# Patient Record
Sex: Male | Born: 1990 | Race: White | Hispanic: No | Marital: Married | Smoking: Former smoker
Health system: Southern US, Community
[De-identification: ages and names within clinical notes are randomized; demographics above are authoritative.]

## PROBLEM LIST (undated history)

## (undated) DIAGNOSIS — J45909 Unspecified asthma, uncomplicated: Secondary | ICD-10-CM

## (undated) HISTORY — DX: Unspecified asthma, uncomplicated: J45.909

---

## 2013-07-09 DIAGNOSIS — J45909 Unspecified asthma, uncomplicated: Secondary | ICD-10-CM

## 2013-07-09 HISTORY — DX: Unspecified asthma, uncomplicated: J45.909

## 2016-05-18 ENCOUNTER — Ambulatory Visit (INDEPENDENT_AMBULATORY_CARE_PROVIDER_SITE_OTHER): Payer: BC Managed Care – PPO | Admitting: Family Medicine

## 2016-05-18 ENCOUNTER — Encounter: Payer: Self-pay | Admitting: Family Medicine

## 2016-05-18 ENCOUNTER — Ambulatory Visit (INDEPENDENT_AMBULATORY_CARE_PROVIDER_SITE_OTHER): Payer: BC Managed Care – PPO

## 2016-05-18 DIAGNOSIS — M79644 Pain in right finger(s): Secondary | ICD-10-CM

## 2016-05-18 DIAGNOSIS — M20021 Boutonniere deformity of right finger(s): Secondary | ICD-10-CM | POA: Diagnosis not present

## 2016-05-18 DIAGNOSIS — M25562 Pain in left knee: Secondary | ICD-10-CM

## 2016-05-18 DIAGNOSIS — G8929 Other chronic pain: Secondary | ICD-10-CM | POA: Diagnosis not present

## 2016-05-18 DIAGNOSIS — W208XXA Other cause of strike by thrown, projected or falling object, initial encounter: Secondary | ICD-10-CM

## 2016-05-18 DIAGNOSIS — S63266A Dislocation of metacarpophalangeal joint of right little finger, initial encounter: Secondary | ICD-10-CM | POA: Diagnosis not present

## 2016-05-18 DIAGNOSIS — Y9367 Activity, basketball: Secondary | ICD-10-CM | POA: Diagnosis not present

## 2016-05-18 DIAGNOSIS — M25561 Pain in right knee: Secondary | ICD-10-CM

## 2016-05-18 MED ORDER — DICLOFENAC SODIUM 1 % TD GEL: 4.0000 g | Freq: Four times a day (QID) | TRANSDERMAL | 11 refills | Status: DC

## 2016-05-18 NOTE — Progress Notes (Signed)
Juan Bryan is a 25 y.o. male who presents to [facility] today for right fifth digit injury and bilateral knee pain.  Right finger injury: Patient was playing basketball about 6 months ago when he jammed his right pinky finger. He developed pain and swelling at the PIP. The swelling has gone down however he continues to experience pain especially with ulnar deviation of the PIP joint. He also notes inability to fully extend the PIP joint. He denies any weakness or limitations with flexion. He has not had this evaluated yet. He's tried some over-the-counter medications which helped only a little.  Bilateral knee pain: Present for 2 weeks. Patient notes anterior bilateral knee pain. The insertion of the patellar tendon on the tibia occurring after doing leg presses. He denies any injury swelling locking catching giving way. He does note some popping however. He's tried some over-the-counter medicines which have helped only a little.   History reviewed. No pertinent past medical history. No relevant past medical or surgical history. History reviewed. No pertinent surgical history. Social History  Substance Use Topics  . Smoking status: Never Smoker  . Smokeless tobacco: Never Used  . Alcohol use Not on file   family history is not on file.  ROS:  No headache, visual changes, nausea, vomiting, diarrhea, constipation, dizziness, abdominal pain, skin rash, fevers, chills, night sweats, weight loss, swollen lymph nodes, body aches, joint swelling, muscle aches, chest pain, shortness of breath, mood changes, visual or auditory hallucinations.    Medications: Current Outpatient Prescriptions  Medication Sig Dispense Refill  . albuterol (PROVENTIL HFA;VENTOLIN HFA) 108 (90 Base) MCG/ACT inhaler INHALE 1 TO 2 PUFFS EVERY 4 TO 6 HOURS AS NEEDED    . diclofenac sodium (VOLTAREN) 1 % GEL Apply 4 g topically 4 (four) times daily. To affected joint. 100 g  11   No current facility-administered medications for this visit.    No Known Allergies   Exam:  BP (!) 158/85   Pulse 80   Ht 6\' 2"  (1.88 m)   Wt 290 lb (131.5 kg)   BMI 37.23 kg/m  General: Well Developed, well nourished, and in no acute distress.  Obese Neuro/Psych: Alert and oriented x3, extra-ocular muscles intact, able to move all 4 extremities, sensation grossly intact. Skin: Warm and dry, no rashes noted.  Respiratory: Not using accessory muscles, speaking in full sentences, trachea midline.  Cardiovascular: Pulses palpable, no extremity edema. Abdomen: Does not appear distended. MSK: Right hand: Normal except for fifth digit. Inability to fully extend the PIP joint by about 10. Normal MCP and DIP motion. Normal flexion. Rotation deformity or laxity. Tender to palpation PIP. Pain with ulnar deviation of PIP joint.  Knees bilaterally are unremarkable appearing. Mildly tender to palpation patella tendon insertion onto the tibia bilaterally. Normal flexion and extension strength. Normal motion. Neck/stable ligamentous exam.     No results found for this or any previous visit (from the past 48 hour(s)). Dg Knee Complete 4 Views Left  Result Date: 05/18/2016 CLINICAL DATA:  Chronic bilateral knee pain without reported injury. EXAM: LEFT KNEE - COMPLETE 4+ VIEW COMPARISON:  None. FINDINGS: No evidence of fracture, dislocation, or joint effusion. No evidence of arthropathy or other focal bone abnormality. Soft tissues are unremarkable. IMPRESSION: Normal left knee. Electronically Signed   By: Lupita RaiderJames  Green Jr, M.D.   On: 05/18/2016 09:41   Dg Knee Complete 4 Views Right  Result Date: 05/18/2016 CLINICAL DATA:  Chronic bilateral knee pain.  EXAM: RIGHT KNEE - COMPLETE 4+ VIEW COMPARISON:  None. FINDINGS: No evidence of fracture, dislocation, or joint effusion. No evidence of arthropathy or other focal bone abnormality. Soft tissues are unremarkable. IMPRESSION: Normal right  knee. Electronically Signed   By: Lupita RaiderJames  Green Jr, M.D.   On: 05/18/2016 09:41   Dg Finger Little Right  Result Date: 05/18/2016 CLINICAL DATA:  Chronic right fifth finger discomfort after a basketball injury 6 months ago. EXAM: RIGHT LITTLE FINGER 2+V COMPARISON:  None in PACs FINDINGS: The bones are subjectively adequately mineralized. There is a bony fragment adjacent to the ventral aspect of the base of the middle phalanx. This is consistent with an prior avulsion fracture at the volar plate. The IP joint spaces are preserved. The MCP joint space is preserved. The soft tissues are unremarkable. IMPRESSION: Avulsion from the ventral aspect of the base of the middle phalanx of the fifth finger. Electronically Signed   By: David  Swazil[place]Jordan M.D.   On: 05/18/2016 09:40      Assessment and Plan: 25 y.o. male with  Right fifth digit: Healed old fracture present. Patient may have a central slip as he has a mild boutonniere deformity. Plan to refer daily hand therapy and recheck in 2 months. Treat symptomatically Voltaren gel.  Bilateral knee pain likely patellar tendinitis. Plan for diclofenac gel at home exercise program. Recheck as needed.    Orders Placed This Encounter  Procedures  . DG Finger Little Right    Order Specific Question:   Reason for exam:    Answer:   pip injury 6 months ago    Order Specific Question:   Preferred imaging location?    Answer:   [facility]  . DG Knee Complete 4 Views Left    Please include patellar sunrise, lateral, and weightbearing bilateral AP and bilateral rosenberg views    Standing Status:   Future    Number of Occurrences:   1    Standing Expiration Date:   07/18/2017    Order Specific Question:   Reason for exam:    Answer:   Please include patellar sunrise, lateral, and weightbearing bilateral AP and bilateral rosenberg views    Comments:   Please include patellar sunrise, lateral, and weightbearing bilateral AP and bilateral rosenberg  views    Order Specific Question:   Preferred imaging location?    Answer:   [facility]  . DG Knee Complete 4 Views Right    Please include patellar sunrise, lateral, and weightbearing bilateral AP and bilateral rosenberg views    Standing Status:   Future    Number of Occurrences:   1    Standing Expiration Date:   07/18/2017    Order Specific Question:   Reason for exam:    Answer:   Please include patellar sunrise, lateral, and weightbearing bilateral AP and bilateral rosenberg views    Comments:   Please include patellar sunrise, lateral, and weightbearing bilateral AP and bilateral rosenberg views    Order Specific Question:   Preferred imaging location?    Answer:   [facility]  . Ambulatory referral to Physical Therapy    Referral Priority:   Routine    Referral Type:   Physical Medicine    Referral Reason:   Specialty Services Required    Requested Specialty:   Physical Therapy    Number of Visits Requested:   1    Discussed warning signs or symptoms. Please see discharge instructions. Patient expresses understanding.

## 2016-05-18 NOTE — Patient Instructions (Signed)
Thank you for coming in today. Attend Hand Therapy.  Use voltaren gel.  Return for recheck in 1-2 months.    Patellar Tendinitis Rehab Ask your health care provider which exercises are safe for you. Do exercises exactly as told by your health care provider and adjust them as directed. It is normal to feel mild stretching, pulling, tightness, or discomfort as you do these exercises, but you should stop right away if you feel sudden pain or your pain gets worse.Do not begin these exercises until told by your health care provider. Stretching and range of motion exercises This exercise warms up your muscles and joints and improves the movement and flexibility of your knee. This exercise also helps to relieve pain and stiffness. Exercise A: Hamstring, doorway 1. Lie on your back in front of a doorway with your __________ leg resting against the wall and your other leg flat on the floor in the doorway. There should be a slight bend in your __________ knee. 2. Straighten your __________ knee. You should feel a stretch behind your knee or thigh. If you do not, scoot your buttocks closer to the door. 3. Hold this position for __________ seconds. Repeat __________ times. Complete this stretch __________ times a day. Strengthening exercises These exercises build strength and endurance in your knee. Endurance is the ability to use your muscles for a long time, even after they get tired. Exercise B: Quadriceps, isometric 1. Lie on your back with your __________ leg extended and your other knee bent. 2. Slowly tense the muscles in the front of your __________ thigh. When you do this, you should see your kneecap slide up toward your hip or see increased dimpling just above the knee. This motion will push the back of your knee toward the floor. If this is painful, try putting a rolled-up hand towel under your knee to support it in a bent position. Change the size of the towel to find a position that allows you  to do this exercise without any pain. 3. For __________ seconds, hold the muscle as tight as you can without increasing your pain. 4. Relax the muscles slowly and completely. Repeat __________ times. Complete this exercise __________ times a day. Exercise C: Straight leg raises (quadriceps) 1. Lie on your back with your __________ leg extended and your other knee bent. 2. Tense the muscles in the front of your __________ thigh. When you do this, you should see your kneecap slide up or see increased dimpling just above the knee. 3. Keep these muscles tight as you raise your leg 4-6 inches (10-15 cm) off the floor. Do not let your moving knee bend. 4. Hold this position for __________ seconds. 5. Keep these muscles tense as you slowly lower your leg. 6. Relax your muscles slowly and completely. Repeat __________ times. Complete this exercise __________ times a day. Exercise D: Squats 1. Stand in front of a table, with your feet and knees pointing straight ahead. You may rest your hands on the table for balance but not for support. 2. Slowly bend your knees and lower your hips like you are going to sit in a chair.  Keep your weight over your heels, not over your toes.  Keep your lower legs upright so they are parallel with the table legs.  Do not let your hips go lower than your knees.  Do not bend lower than told by your health care provider.  If your knee pain increases, do not bend as low. 3.  Hold the squat position for __________ seconds. 4. Slowly push with your legs to return to standing. Do not use your hands to pull yourself to standing. Repeat __________ times. Complete this exercise __________ times a day. Exercise E: Step-downs 1. Stand on the edge of a step. 2. Keeping your weight over your __________ heel, slowly bend your __________ knee to bring your __________ heel toward the floor. Lower your heel as far as you can while keeping control and without increasing any  discomfort.  Do not let your __________ knee come forward.  Use your leg muscles, not gravity, to lower your body.  Hold a wall or rail for balance if needed. 3. Slowly push through your heel to lift your body weight back up. 4. Return to the starting position. Repeat __________ times. Complete this exercise __________ times a day. Exercise F: Straight leg raises (hip abductors) 1. Lie on your side with your __________ leg in the top position. Lie so your head, shoulder, knee, and hip line up. You may bend your lower knee to help you keep your balance. 2. Roll your hips slightly forward, so that your hips are stacked directly over each other and your __________ knee is facing forward. 3. Leading with your heel, lift your top leg 4-6 inches (10-15 cm). You should feel the muscles in your outer hip lifting.  Do not let your foot drift forward.  Do not let your knee roll toward the ceiling. 4. Hold this position for __________ seconds. 5. Slowly lower your leg to the starting position. 6. Let your muscles relax completely after each repetition. Repeat __________ times. Complete this exercise __________ times a day. This information is not intended to replace advice given to you by your health care provider. Make sure you discuss any questions you have with your health care provider. Document Released: 06/21/2005 Document Revised: 02/26/2016 Document Reviewed: 03/25/2015 Elsevier Interactive Patient Education  2017 [employer]

## 2016-06-02 ENCOUNTER — Ambulatory Visit (INDEPENDENT_AMBULATORY_CARE_PROVIDER_SITE_OTHER): Payer: BC Managed Care – PPO | Admitting: Physician Assistant

## 2016-06-02 ENCOUNTER — Encounter: Payer: Self-pay | Admitting: Physician Assistant

## 2016-06-02 VITALS — BP 134/88 | HR 73 | Ht 74.0 in | Wt 288.0 lb

## 2016-06-02 DIAGNOSIS — E6609 Other obesity due to excess calories: Secondary | ICD-10-CM | POA: Diagnosis not present

## 2016-06-02 DIAGNOSIS — Z131 Encounter for screening for diabetes mellitus: Secondary | ICD-10-CM | POA: Diagnosis not present

## 2016-06-02 DIAGNOSIS — E66811 Obesity, class 1: Secondary | ICD-10-CM | POA: Insufficient documentation

## 2016-06-02 DIAGNOSIS — Z13220 Encounter for screening for lipoid disorders: Secondary | ICD-10-CM

## 2016-06-02 DIAGNOSIS — J4520 Mild intermittent asthma, uncomplicated: Secondary | ICD-10-CM | POA: Diagnosis not present

## 2016-06-02 MED ORDER — ALBUTEROL SULFATE HFA 108 (90 BASE) MCG/ACT IN AERS: INHALATION_SPRAY | RESPIRATORY_TRACT | 2 refills | Status: DC

## 2016-06-02 NOTE — Progress Notes (Signed)
   Subjective:    Patient ID: Juan Bryan, male    DOB: 12/30/1990, 25 y.o.   MRN: [id number]  HPI  Pt is a 25 yo male who presents to the clinic to establish care. He has no concerns or complaints today. He just wanted labs to make sure everything "looked good".   Negative PMH.   .. Family History  Problem Relation Age of Onset  . Diabetes Mother   . Hypertension Mother   . Diabetes Father   . Hypertension Father    .Marland Kitchen. Social History   Social History  . Marital status: Unknown    Spouse name: N/A  . Number of children: N/A  . Years of education: N/A   Occupational History  . Not on file.   Social History Main Topics  . Smoking status: Former Games developermoker  . Smokeless tobacco: Never Used  . Alcohol use Yes  . Drug use: No  . Sexual activity: Yes   Other Topics Concern  . Not on file   Social History Narrative  . No narrative on file   Pt has lost weight over the past year. He weighs 286 down from 320. He continues to exercise and watch diet.    Asthma- controlled with rescue inhaler use once or twice a month.   Review of Systems  All other systems reviewed and are negative.      Objective:   Physical Exam  Constitutional: He is oriented to person, place, and time. He appears well-developed and well-nourished.  HENT:  Head: Normocephalic and atraumatic.  Cardiovascular: Normal rate, regular rhythm and normal heart sounds.   Pulmonary/Chest: Effort normal and breath sounds normal.  Neurological: He is alert and oriented to person, place, and time.  Psychiatric: He has a normal mood and affect. His behavior is normal.          Assessment & Plan:  Marland Kitchen.Marland Kitchen.Juan Bryan was seen today for establish care.  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 2nd check better. Continue on monitor and if running above 130/90 follow up.

## 2016-07-12 ENCOUNTER — Encounter: Payer: Self-pay | Admitting: Family Medicine

## 2016-07-12 ENCOUNTER — Ambulatory Visit (INDEPENDENT_AMBULATORY_CARE_PROVIDER_SITE_OTHER): Payer: BC Managed Care – PPO | Admitting: Family Medicine

## 2016-07-12 VITALS — BP 152/89 | HR 93 | Temp 98.3°F | Wt 291.0 lb

## 2016-07-12 DIAGNOSIS — J069 Acute upper respiratory infection, unspecified: Secondary | ICD-10-CM | POA: Diagnosis not present

## 2016-07-12 DIAGNOSIS — B9789 Other viral agents as the cause of diseases classified elsewhere: Secondary | ICD-10-CM | POA: Diagnosis not present

## 2016-07-12 MED ORDER — IPRATROPIUM BROMIDE 0.06 % NA SOLN: 2.0000 | NASAL | 6 refills | Status: DC | PRN

## 2016-07-12 MED ORDER — PREDNISONE 10 MG PO TABS: 30.0000 mg | ORAL_TABLET | Freq: Every day | ORAL | 0 refills | Status: DC

## 2016-07-12 MED ORDER — BENZONATATE 200 MG PO CAPS: 200.0000 mg | ORAL_CAPSULE | Freq: Three times a day (TID) | ORAL | 0 refills | Status: DC | PRN

## 2016-07-12 MED ORDER — AZITHROMYCIN 250 MG PO TABS: 250.0000 mg | ORAL_TABLET | Freq: Every day | ORAL | 0 refills | Status: DC

## 2016-07-12 NOTE — Patient Instructions (Signed)
Thank you for coming in today. Call or go to the emergency room if you get worse, have trouble breathing, have chest pains, or palpitations.    Acute Bronchitis, Adult Acute bronchitis is sudden (acute) swelling of the air tubes (bronchi) in the lungs. Acute bronchitis causes these tubes to fill with mucus, which can make it hard to breathe. It can also cause coughing or wheezing. In adults, acute bronchitis usually goes away within 2 weeks. A cough caused by bronchitis may last up to 3 weeks. Smoking, allergies, and asthma can make the condition worse. Repeated episodes of bronchitis may cause further lung problems, such as chronic obstructive pulmonary disease (COPD). What are the causes? This condition can be caused by germs and by substances that irritate the lungs, including:  Cold and flu viruses. This condition is most often caused by the same virus that causes a cold.  Bacteria.  Exposure to tobacco smoke, dust, fumes, and air pollution. What increases the risk? This condition is more likely to develop in people who:  Have close contact with someone with acute bronchitis.  Are exposed to lung irritants, such as tobacco smoke, dust, fumes, and vapors.  Have a weak immune system.  Have a respiratory condition such as asthma. What are the signs or symptoms? Symptoms of this condition include:  A cough.  Coughing up clear, yellow, or green mucus.  Wheezing.  Chest congestion.  Shortness of breath.  A fever.  Body aches.  Chills.  A sore throat. How is this diagnosed? This condition is usually diagnosed with a physical exam. During the exam, your health care provider may order tests, such as chest X-rays, to rule out other conditions. He or she may also:  Test a sample of your mucus for bacterial infection.  Check the level of oxygen in your blood. This is done to check for pneumonia.  Do a chest X-ray or lung function testing to rule out pneumonia and other  conditions.  Perform blood tests. Your health care provider will also ask about your symptoms and medical history. How is this treated? Most cases of acute bronchitis clear up over time without treatment. Your health care provider may recommend:  Drinking more fluids. Drinking more makes your mucus thinner, which may make it easier to breathe.  Taking a medicine for a fever or cough.  Taking an antibiotic medicine.  Using an inhaler to help improve shortness of breath and to control a cough.  Using a cool mist vaporizer or humidifier to make it easier to breathe. Follow these instructions at home: Medicines  Take over-the-counter and prescription medicines only as told by your health care provider.  If you were prescribed an antibiotic, take it as told by your health care provider. Do not stop taking the antibiotic even if you start to feel better. General instructions  Get plenty of rest.  Drink enough fluids to keep your urine clear or pale yellow.  Avoid smoking and secondhand smoke. Exposure to cigarette smoke or irritating chemicals will make bronchitis worse. If you smoke and you need help quitting, ask your health care provider. Quitting smoking will help your lungs heal faster.  Use an inhaler, cool mist vaporizer, or humidifier as told by your health care provider.  Keep all follow-up visits as told by your health care provider. This is important. How is this prevented? To lower your risk of getting this condition again:  Wash your hands often with soap and water. If soap and water are  not available, use hand sanitizer.  Avoid contact with people who have cold symptoms.  Try not to touch your hands to your mouth, nose, or eyes.  Make sure to get the flu shot every year. Contact a health care provider if:  Your symptoms do not improve in 2 weeks of treatment. Get help right away if:  You cough up blood.  You have chest pain.  You have severe shortness of  breath.  You become dehydrated.  You faint or keep feeling like you are going to faint.  You keep vomiting.  You have a severe headache.  Your fever or chills gets worse. This information is not intended to replace advice given to you by your health care provider. Make sure you discuss any questions you have with your health care provider. Document Released: 07/29/2004 Document Revised: 01/14/2016 Document Reviewed: 12/10/2015 Elsevier Interactive Patient Education  2017 [employer].

## 2016-07-12 NOTE — Progress Notes (Signed)
       Juan Bryan is a 26 y.o. male who [facility] today for sore throat cough congestion postnasal drainage. Symptoms present for 10 days worsening recently. No vomiting or diarrhea chest pain or palpitations. He's tried some over-the-counter medicines which has helped a bit. Patient notes cough is nonproductive. He notes his symptoms are not consistent with previous episodes of asthma exacerbations.   No past medical history on file. No past surgical history on file. Social History  Substance Use Topics  . Smoking status: Former Games developermoker  . Smokeless tobacco: Never Used  . Alcohol use Yes   family history includes Diabetes in his father and mother; Hypertension in his father and mother.  ROS as above:  Medications: Current Outpatient Prescriptions  Medication Sig Dispense Refill  . albuterol (PROVENTIL HFA;VENTOLIN HFA) 108 (90 Base) MCG/ACT inhaler INHALE 1 TO 2 PUFFS EVERY 4 TO 6 HOURS AS NEEDED 1 Inhaler 2  . azithromycin (ZITHROMAX) 250 MG tablet Take 1 tablet (250 mg total) by mouth daily. Take first 2 tablets together, then 1 every day until finished. 6 tablet 0  . benzonatate (TESSALON) 200 MG capsule Take 1 capsule (200 mg total) by mouth 3 (three) times daily as needed for cough. 45 capsule 0  . ipratropium (ATROVENT) 0.06 % nasal spray Place 2 sprays into both nostrils every 4 (four) hours as needed for rhinitis. 10 mL 6  . predniSONE (DELTASONE) 10 MG tablet Take 3 tablets (30 mg total) by mouth daily with breakfast. 15 tablet 0   No current facility-administered medications for this visit.    No Known Allergies  Health Maintenance Health Maintenance  Topic Date Due  . HIV Screening  10/09/2005  . TETANUS/TDAP  10/09/2009  . INFLUENZA VACCINE  06/02/2017 (Originally 02/03/2016)     Exam:  BP (!) 152/89   Pulse 93   Temp 98.3 F (36.8 C)  (Oral)   Wt 291 lb (132 kg)   SpO2 99%   BMI 37.36 kg/m  Gen: Well NAD HEENT: EOMI,  MMM Clear nasal drainage in posterior pharynx with cobblestoning. Normal tympanic membranes bilaterally. No cervical lymphadenopathy. Lungs: Normal work of breathing. CTABL Heart: RRR no MRG Abd: NABS, Soft. Nondistended, Nontender Exts: Brisk capillary refill, warm and well perfused.    No results found for this or any previous visit (from the past 72 hour(s)). No results found.    Assessment and Plan: 26 y.o. male with pharyngitis and cough with second sickening. Treatment empirically with Atrovent nasal spray Tessalon Perles. Use prednisone and azithromycin if not better. Return as needed.   No orders of the defined types were placed in this encounter.   Discussed warning signs or symptoms. Please see discharge instructions. Patient expresses understanding.

## 2016-07-13 ENCOUNTER — Ambulatory Visit: Payer: BC Managed Care – PPO | Admitting: Family Medicine

## 2016-07-15 ENCOUNTER — Ambulatory Visit (INDEPENDENT_AMBULATORY_CARE_PROVIDER_SITE_OTHER): Payer: BC Managed Care – PPO

## 2016-07-15 ENCOUNTER — Encounter: Payer: Self-pay | Admitting: Family Medicine

## 2016-07-15 ENCOUNTER — Ambulatory Visit (INDEPENDENT_AMBULATORY_CARE_PROVIDER_SITE_OTHER): Payer: BC Managed Care – PPO | Admitting: Family Medicine

## 2016-07-15 VITALS — BP 156/100 | HR 110 | Temp 98.1°F | Wt 292.0 lb

## 2016-07-15 DIAGNOSIS — R0602 Shortness of breath: Secondary | ICD-10-CM | POA: Diagnosis not present

## 2016-07-15 DIAGNOSIS — R05 Cough: Secondary | ICD-10-CM | POA: Diagnosis not present

## 2016-07-15 DIAGNOSIS — R062 Wheezing: Secondary | ICD-10-CM

## 2016-07-15 DIAGNOSIS — J4520 Mild intermittent asthma, uncomplicated: Secondary | ICD-10-CM | POA: Diagnosis not present

## 2016-07-15 MED ORDER — GUAIFENESIN-CODEINE 100-10 MG/5ML PO SOLN: 5.0000 mL | Freq: Every evening | ORAL | 0 refills | Status: DC | PRN

## 2016-07-15 MED ORDER — METHYLPREDNISOLONE 8 MG PO TABS: 8.0000 mg | ORAL_TABLET | Freq: Every day | ORAL | 0 refills | Status: DC

## 2016-07-15 MED ORDER — IPRATROPIUM-ALBUTEROL 0.5-2.5 (3) MG/3ML IN SOLN: 3.0000 mL | Freq: Once | RESPIRATORY_TRACT | Status: DC

## 2016-07-15 NOTE — Progress Notes (Signed)
Juan Bryan is a 26 y.o. male who [facility] today for cough congestion shortness of breath runny nose and wheezing. This is also associated with a headache. Symptoms have been ongoing now for some time. Patient was seen on January 8 URI and prescribed Atrovent nasal spray, albuterol, and Tessalon Perles. He was also prescribed backup prednisone and azithromycin. He has started the azithromycin but not started taking prednisone. He notes he thinks he may be allergic to prednisone he felt hot spots on his body when he was taking and after a root canal in the past.  He notes he's been using his albuterol infrequently but thinks it does help his shortness of breath when he does use it. He is a pertinent medical history significant for asthma. He denies any vomiting or diarrhea.   Past Medical History:  Diagnosis Date  . Asthma 07/09/2013   No past surgical history on file. Social History  Substance Use Topics  . Smoking status: Former Games developermoker  . Smokeless tobacco: Never Used  . Alcohol use Yes   family history includes Diabetes in his father and mother; Hypertension in his father and mother.  ROS as above:  Medications: Current Outpatient Prescriptions  Medication Sig Dispense Refill  . albuterol (PROVENTIL HFA;VENTOLIN HFA) 108 (90 Base) MCG/ACT inhaler INHALE 1 TO 2 PUFFS EVERY 4 TO 6 HOURS AS NEEDED 1 Inhaler 2  . azithromycin (ZITHROMAX) 250 MG tablet Take 1 tablet (250 mg total) by mouth daily. Take first 2 tablets together, then 1 every day until finished. 6 tablet 0  . benzonatate (TESSALON) 200 MG capsule Take 1 capsule (200 mg total) by mouth 3 (three) times daily as needed for cough. 45 capsule 0  . guaiFENesin-codeine 100-10 MG/5ML syrup Take 5 mLs by mouth at bedtime as needed for cough. 180 mL 0  . ipratropium (ATROVENT) 0.06 % nasal spray Place 2  sprays into both nostrils every 4 (four) hours as needed for rhinitis. 10 mL 6  . methylPREDNISolone (MEDROL) 8 MG tablet Take 1 tablet (8 mg total) by mouth daily. 5 tablet 0   Current Facility-Administered Medications  Medication Dose Route Frequency Provider Last Rate Last Dose  . ipratropium-albuterol (DUONEB) 0.5-2.5 (3) MG/3ML nebulizer solution 3 mL  3 mL Nebulization Once Rodolph BongEvan S Jeanne Diefendorf, MD       No Known Allergies  Health Maintenance Health Maintenance  Topic Date Due  . HIV Screening  10/09/2005  . TETANUS/TDAP  10/09/2009  . INFLUENZA VACCINE  06/02/2017 (Originally 02/03/2016)     Exam:  BP (!) 156/100   Pulse (!) 110   Temp 98.1 F (36.7 C) (Oral)   Wt 292 lb (132.5 kg)   BMI 37.49 kg/m  Gen: Well NAD HEENT: EOMI,  MMM Clear nasal discharge. Posterior pharynx cobblestoning. Normal tympanic membranes bilaterally. Lungs: Normal work of breathing. Prolonged respiratory phase present bilaterally Heart: Mild tachycardia no MRG Abd: NABS, Soft. Nondistended, Nontender Exts: Brisk capillary refill, warm and well perfused.   Patient was given a 2.5/0.5 mg DuoNeb nebulizer treatment, and felt better   No results found for this or any previous visit (from the past 72 hour(s)). Dg Chest 2 View  Result Date: 07/15/2016 CLINICAL DATA:  Cough and shortness of Breath EXAM: CHEST  2 VIEW COMPARISON:  None. FINDINGS: The heart size and mediastinal contours are within normal limits. Both lungs are clear. The visualized skeletal structures are unremarkable. IMPRESSION: No  active cardiopulmonary disease. Electronically Signed   By: Alcide Clever M.D.   On: 07/15/2016 13:49      Assessment and Plan: 26 y.o. male with Worsening respiratory symptoms very likely due to virus. Patient also has asthma and I think this is contributing to his symptoms. Plan to check a chest x-ray to assess for pneumonia. Will use albuterol more frequently and prescribed codeine for cough suppression. I'm  doubtful that patient truly has an allergy to prednisone however it's reasonable to try use a different type of steroid. We'll use Medrol daily and recheck if not better.    Orders Placed This Encounter  Procedures  . DG Chest 2 View    Order Specific Question:   Reason for exam:    Answer:   Cough, assess intra-thoracic pathology    Order Specific Question:   Preferred imaging location?    Answer:   [facility]    Discussed warning signs or symptoms. Please see discharge instructions. Patient expresses understanding.

## 2016-07-15 NOTE — Patient Instructions (Signed)
Thank you for coming in today. Take the medrol daily.  Use albuterol as needed for wheezing.  Get a chest xray today.  Use the cough medicine at bedtime.   Call or go to the emergency room if you get worse, have trouble breathing, have chest pains, or palpitations.

## 2016-08-18 ENCOUNTER — Ambulatory Visit (INDEPENDENT_AMBULATORY_CARE_PROVIDER_SITE_OTHER): Payer: BC Managed Care – PPO | Admitting: Physician Assistant

## 2016-08-18 ENCOUNTER — Encounter: Payer: Self-pay | Admitting: Physician Assistant

## 2016-08-18 VITALS — BP 175/101 | HR 73 | Temp 98.0°F | Ht 74.0 in | Wt 285.0 lb

## 2016-08-18 DIAGNOSIS — N50812 Left testicular pain: Secondary | ICD-10-CM | POA: Diagnosis not present

## 2016-08-18 DIAGNOSIS — I861 Scrotal varices: Secondary | ICD-10-CM | POA: Diagnosis not present

## 2016-08-18 DIAGNOSIS — I10 Essential (primary) hypertension: Secondary | ICD-10-CM

## 2016-08-18 DIAGNOSIS — N5089 Other specified disorders of the male genital organs: Secondary | ICD-10-CM

## 2016-08-18 DIAGNOSIS — N509 Disorder of male genital organs, unspecified: Secondary | ICD-10-CM | POA: Diagnosis not present

## 2016-08-18 NOTE — Progress Notes (Signed)
   Subjective:    Patient ID: Juan Bryan, male    DOB: 05/31/1991, 26 y.o.   MRN: [id number]  HPI  Pt is a 26 yo male who presents to the clinic with painful lump of left testicle for last month. He noticed it after lifting heavy weights at gym and not being used to it. Pain is not constant. At times it will be sharp and radiate into left abdomen. Not tried anything to make better. Seems to only be worse with lifting weights. No swelling, redness. No fever, chills.    BP elevated. Denies any CP, palpitations, headaches, dizziness. Never been on medication.     Review of Systems  All other systems reviewed and are negative.      Objective:   Physical Exam  Constitutional: He appears well-developed and well-nourished.  HENT:  Head: Normocephalic and atraumatic.  Cardiovascular: Normal rate, regular rhythm and normal heart sounds.   Genitourinary:  Genitourinary Comments: Testicular exam was normal. I did not palpate any mass. No hernia detected. No swelling, redness, or tenderness.  There was an area where patient stated he felt mass and seemed to be a small variocele.           Assessment & Plan:  Marland Kitchen.Marland Kitchen.Diagnoses and all orders for this visit:  Left testicular pain -     US Art/Ven Flow Abd Pelv Doppler -     US Scrotum; Future -     US Scrotum  Mass of left testis -     US Art/Ven Flow Abd Pelv Doppler -     US Scrotum; Future -     US Scrotum  Essential hypertension -     lisinopril (PRINIVIL,ZESTRIL) 10 MG tablet; Take 1 tablet (10 mg total) by mouth daily. For blood pressure control.  Varicocele   HO given. Reassurance given. Watch lifting weights. If pain worsens or continues let me know. U/S ordered for full evaluation. NSAID and ice as needed for discomfort.   BP medication started. BP elevated today. Need to get down. Recheck in 1 month.

## 2016-08-18 NOTE — Patient Instructions (Signed)
Varicocele A varicocele is a swelling of veins in the scrotum. The scrotum is the sac that contains the testicles. Varicoceles can occur on either side of the scrotum, but they are more common on the left side. They occur most often in teenage boys and young men. In most cases, varicoceles are not a serious problem. They are usually small and painless and do not require treatment. Tests may be done to confirm the diagnosis. Treatment may be needed if:  A varicocele is large, causes a lot of pain, or causes pain when exercising.  Varicoceles are found on both sides of the scrotum.  The testicle on the opposite side is absent or not normal.  A varicocele causes a decrease in the size of the testicle in a growing adolescent.  The person has fertility problems. What are the causes? This condition is the result of valves in the veins not working properly. Valves in the veins help to return blood from the scrotum and testicles to the heart. If these valves do not work well, blood flows backward and backs up into the veins, which causes the veins to swell. This is similar to what happens when varicose veins form in the leg. What are the signs or symptoms? Most varicoceles do not cause any symptoms. If symptoms do occur, they may include:  Swelling on one side of the scrotum. The swelling may be more obvious when you are standing up.  A lumpy feeling in the scrotum.  A heavy feeling on one side of the scrotum.  A dull ache in the scrotum, especially after exercise or prolonged standing or sitting.  Slower growth or reduced size of the testicle on the side of the varicocele (in young males).  Problems with fertility. These can occur if the testicle does not grow normally. How is this diagnosed? This condition may be diagnosed with a physical exam. You may also have an imaging test, called an ultrasound, to confirm the diagnosis and to help rule out other causes of the swelling. How is this  treated? Treatment is usually not needed for this condition. If you have any pain, your health care provider may prescribe or recommend medicine to help relieve it. You may need regular exams so your health care provider can monitor the varicocele to ensure that it does not cause problems. When further treatment is needed, it may involve one of these options:  Varicocelectomy. This is a surgery in which the swollen veins are tied off so that the flow of blood goes to other veins instead.  Embolization. In this procedure, a small tube (catheter) is used to place metal coils or other blocking items in the veins. This cuts off the blood flow to the swollen veins. Follow these instructions at home:  Take medicines only as directed by your health care provider.  Wear supportive underwear.  Use an athletic supporter for sports.  Keep all follow-up visits as directed by your health care provider. This is important. Contact a health care provider if:  Your pain is increasing.  You have redness in the affected area.  You have swelling that does not decrease when you are lying down.  One of your testicles is smaller than the other.  Your testicle becomes enlarged, swollen, or painful. This information is not intended to replace advice given to you by your health care provider. Make sure you discuss any questions you have with your health care provider. Document Released: 09/27/2000 Document Revised: 12/03/2015 Document Reviewed: 05/29/2014  Elsevier Interactive Patient Education  2017 Elsevier Inc[employer]  

## 2016-08-20 DIAGNOSIS — I861 Scrotal varices: Secondary | ICD-10-CM | POA: Insufficient documentation

## 2016-08-20 DIAGNOSIS — I10 Essential (primary) hypertension: Secondary | ICD-10-CM | POA: Insufficient documentation

## 2016-08-20 DIAGNOSIS — N50812 Left testicular pain: Secondary | ICD-10-CM | POA: Insufficient documentation

## 2016-08-20 MED ORDER — LISINOPRIL 10 MG PO TABS: 10.0000 mg | ORAL_TABLET | Freq: Every day | ORAL | 1 refills | Status: DC

## 2016-08-24 ENCOUNTER — Ambulatory Visit (INDEPENDENT_AMBULATORY_CARE_PROVIDER_SITE_OTHER): Payer: BC Managed Care – PPO

## 2016-08-24 ENCOUNTER — Ambulatory Visit: Payer: BC Managed Care – PPO

## 2016-08-24 DIAGNOSIS — I861 Scrotal varices: Secondary | ICD-10-CM

## 2016-08-24 NOTE — Progress Notes (Signed)
Call pt: ultrasound showed no mass and normal testicle. Small variocele as discussed in office and gave HO on.

## 2017-04-26 ENCOUNTER — Ambulatory Visit (INDEPENDENT_AMBULATORY_CARE_PROVIDER_SITE_OTHER): Payer: BC Managed Care – PPO | Admitting: Physician Assistant

## 2017-04-26 ENCOUNTER — Encounter: Payer: Self-pay | Admitting: Physician Assistant

## 2017-04-26 VITALS — BP 165/82 | HR 70 | Ht 74.0 in

## 2017-04-26 DIAGNOSIS — J302 Other seasonal allergic rhinitis: Secondary | ICD-10-CM | POA: Diagnosis not present

## 2017-04-26 DIAGNOSIS — R072 Precordial pain: Secondary | ICD-10-CM | POA: Diagnosis not present

## 2017-04-26 DIAGNOSIS — J4521 Mild intermittent asthma with (acute) exacerbation: Secondary | ICD-10-CM | POA: Diagnosis not present

## 2017-04-26 DIAGNOSIS — I10 Essential (primary) hypertension: Secondary | ICD-10-CM | POA: Diagnosis not present

## 2017-04-26 MED ORDER — PREDNISONE 50 MG PO TABS: ORAL_TABLET | ORAL | 0 refills | Status: DC

## 2017-04-26 MED ORDER — MONTELUKAST SODIUM 10 MG PO TABS: 10.0000 mg | ORAL_TABLET | Freq: Every day | ORAL | 3 refills | Status: DC

## 2017-04-26 MED ORDER — ALBUTEROL SULFATE HFA 108 (90 BASE) MCG/ACT IN AERS: 2.0000 | INHALATION_SPRAY | Freq: Four times a day (QID) | RESPIRATORY_TRACT | 3 refills | Status: DC | PRN

## 2017-04-26 NOTE — Progress Notes (Signed)
Subjective:    Patient ID: Juan Bryan, male    DOB: Jun 20, 1991, 26 y.o.   MRN: [id number]  HPI  Pt is a 26 yo old male who presents to the clinic with worsening SOB and chest tightness over past month. He has asthma but has been controlled with rescue inhaler only. He feels like this brand of rescue inhaler is not working well and would like to go back to ventolin. Today he feels better but has been having a dry cough with wheezing. He is also having episodic chest pain in center of chest with exercise and sex. Pain is very sharp but then resolves. No fever, chills, sinus pressure or ear pain. Fall always seems to be his trigger month.   .. Active Ambulatory Problems    Diagnosis Date Noted  . Asthma 07/09/2013  . Finger pain, right 05/18/2016  . Knee pain, bilateral 05/18/2016  . 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 Class 2 obesity due to excess calories without serious comorbidity in adult 06/02/2016  . Essential hypertension 08/20/2016  . Varicocele 08/20/2016  . Left testicular pain 08/20/2016  . Precordial chest pain 04/27/2017  . Mild intermittent asthma with exacerbation 04/27/2017  . Seasonal allergies 04/27/2017   Resolved Ambulatory Problems    Diagnosis Date Noted  . No Resolved Ambulatory Problems   Past Medical History:  Diagnosis Date  . Asthma 07/09/2013      Review of Systems  All other systems reviewed and are negative.      Objective:   Physical Exam  Constitutional: He is oriented to person, place, and time. He appears well-developed and well-nourished.  HENT:  Head: Normocephalic and atraumatic.  Right Ear: External ear normal.  Left Ear: External ear normal.  Nose: Nose normal.  Mouth/Throat: Oropharynx is clear and moist.  Eyes: Conjunctivae are normal. Right eye exhibits no discharge. Left eye exhibits no discharge.  Cardiovascular: Normal rate, regular rhythm and normal heart sounds.   Pulmonary/Chest: Effort normal and  breath sounds normal. He exhibits no tenderness.  Lymphadenopathy:    He has no cervical adenopathy.  Neurological: He is alert and oriented to person, place, and time.  Psychiatric: He has a normal mood and affect. His behavior is normal.          Assessment & Plan:  Marland KitchenMarland KitchenOrlen was seen today for asthma.  Diagnoses and all orders for this visit:  Mild intermittent asthma with exacerbation -     predniSONE (DELTASONE) 50 MG tablet; Take one tablet for 5 days. -     montelukast (SINGULAIR) 10 MG tablet; Take 1 tablet (10 mg total) by mouth at bedtime. -     albuterol (PROVENTIL HFA;VENTOLIN HFA) 108 (90 Base) MCG/ACT inhaler; Inhale 2 puffs into the lungs every 6 (six) hours as needed for wheezing or shortness of breath.  Precordial chest pain -     EKG 12-Lead  Essential hypertension -     lisinopril (PRINIVIL,ZESTRIL) 10 MG tablet; Take 1 tablet (10 mg total) by mouth daily. For blood pressure control.  Seasonal allergies   EKG done with NSR, no ST elevation or depression, no arrthymias. I do not think CP is pathological. Offered exercise stress test but patient prefers to told off at this time. Will send if current treatment helps symptoms.   For asthma exacerbation added prednisone and restart singulair. Sent ventolin to pharmacy to only fill ventolin. Follow up if still having SOB/wheezing or CP. We could consider daily ICS  in future. Discussed does not need to be using rescue daily.   For allergies start flonase daily.   BP remains elevated needs to restart lisinopril.   Follow up in 2-3 weeks as needed.

## 2017-04-26 NOTE — Patient Instructions (Addendum)
Prednisone for 5 days.  Restart singulair daily.  Consider flonase 2 sprays each nostril.  Restart lisinopril  Will get stress test.

## 2017-04-27 ENCOUNTER — Encounter: Payer: Self-pay | Admitting: Physician Assistant

## 2017-04-27 DIAGNOSIS — R072 Precordial pain: Secondary | ICD-10-CM | POA: Insufficient documentation

## 2017-04-27 DIAGNOSIS — J302 Other seasonal allergic rhinitis: Secondary | ICD-10-CM | POA: Insufficient documentation

## 2017-04-27 DIAGNOSIS — J4521 Mild intermittent asthma with (acute) exacerbation: Secondary | ICD-10-CM | POA: Insufficient documentation

## 2017-04-27 MED ORDER — LISINOPRIL 10 MG PO TABS: 10.0000 mg | ORAL_TABLET | Freq: Every day | ORAL | 1 refills | Status: DC

## 2017-04-28 ENCOUNTER — Telehealth: Payer: Self-pay | Admitting: *Deleted

## 2017-04-28 NOTE — Telephone Encounter (Signed)
Pre Authorization sent to cover my meds. [id number]

## 2017-04-29 NOTE — Telephone Encounter (Signed)
Pre-Auth approved for Ventolin HFA 108. [name]: [id number] - [place] Case ID: [id number] - Rx #: [id number] Pharm notified. Called pt and left a detailed msg.

## 2017-05-17 ENCOUNTER — Ambulatory Visit: Payer: BC Managed Care – PPO | Admitting: Physician Assistant

## 2017-05-17 VITALS — BP 171/98 | HR 74 | Temp 97.9°F | Ht 74.02 in | Wt 272.0 lb

## 2017-05-17 DIAGNOSIS — H6983 Other specified disorders of Eustachian tube, bilateral: Secondary | ICD-10-CM

## 2017-05-17 DIAGNOSIS — I10 Essential (primary) hypertension: Secondary | ICD-10-CM | POA: Diagnosis not present

## 2017-05-17 NOTE — Patient Instructions (Signed)
Eustachian Tube Dysfunction The eustachian tube connects the middle ear to the back of the nose. It regulates air pressure in the middle ear by allowing air to move between the ear and nose. It also helps to drain fluid from the middle ear space. When the eustachian tube does not function properly, air pressure, fluid, or both can build up in the middle ear. Eustachian tube dysfunction can affect one or both ears. What are the causes? This condition happens when the eustachian tube becomes blocked or cannot open normally. This may result from:  Ear infections.  Colds and other upper respiratory infections.  Allergies.  Irritation, such as from cigarette smoke or acid from the stomach coming up into the esophagus (gastroesophageal reflux).  Sudden changes in air pressure, such as from descending in an airplane.  Abnormal growths in the nose or throat, such as nasal polyps, tumors, or enlarged tissue at the back of the throat (adenoids).  What increases the risk? This condition may be more likely to develop in people who smoke and people who are overweight. Eustachian tube dysfunction may also be more likely to develop in children, especially children who have:  Certain birth defects of the mouth, such as cleft palate.  Large tonsils and adenoids.  What are the signs or symptoms? Symptoms of this condition may include:  A feeling of fullness in the ear.  Ear pain.  Clicking or popping noises in the ear.  Ringing in the ear.  Hearing loss.  Loss of balance.  Symptoms may get worse when the air pressure around you changes, such as when you travel to an area of high elevation or fly on an airplane. How is this diagnosed? This condition may be diagnosed based on:  Your symptoms.  A physical exam of your ear, nose, and throat.  Tests, such as those that measure: ? The movement of your eardrum (tympanogram). ? Your hearing (audiometry).  How is this treated? Treatment  depends on the cause and severity of your condition. If your symptoms are mild, you may be able to relieve your symptoms by moving air into ("popping") your ears. If you have symptoms of fluid in your ears, treatment may include:  Decongestants.  Antihistamines.  Nasal sprays or ear drops that contain medicines that reduce swelling (steroids).  In some cases, you may need to have a procedure to drain the fluid in your eardrum (myringotomy). In this procedure, a small tube is placed in the eardrum to:  Drain the fluid.  Restore the air in the middle ear space.  Follow these instructions at home:  Take over-the-counter and prescription medicines only as told by your health care provider.  Use techniques to help pop your ears as recommended by your health care provider. These may include: ? Chewing gum. ? Yawning. ? Frequent, forceful swallowing. ? Closing your mouth, holding your nose closed, and gently blowing as if you are trying to blow air out of your nose.  Do not do any of the following until your health care provider approves: ? Travel to high altitudes. ? Fly in airplanes. ? Work in a pressurized cabin or room. ? Scuba dive.  Keep your ears dry. Dry your ears completely after showering or bathing.  Do not smoke.  Keep all follow-up visits as told by your health care provider. This is important. Contact a health care provider if:  Your symptoms do not go away after treatment.  Your symptoms come back after treatment.  You are   unable to pop your ears.  You have: ? A fever. ? Pain in your ear. ? Pain in your head or neck. ? Fluid draining from your ear.  Your hearing suddenly changes.  You become very dizzy.  You lose your balance. This information is not intended to replace advice given to you by your health care provider. Make sure you discuss any questions you have with your health care provider. Document Released: 07/18/2015 Document Revised: 11/27/2015  Document Reviewed: 07/10/2014 Elsevier Interactive Patient Education  2018 Elsevier Inc.  

## 2017-05-18 ENCOUNTER — Encounter: Payer: Self-pay | Admitting: Physician Assistant

## 2017-05-18 DIAGNOSIS — I10 Essential (primary) hypertension: Secondary | ICD-10-CM | POA: Insufficient documentation

## 2017-05-18 NOTE — Progress Notes (Signed)
   Subjective:    Patient ID: Juan Bryan, male    DOB: 10/04/1990, 26 y.o.   MRN: [id number]  HPI Pt is a 26 yo male who presents to the clinic with bilateral ear pain for last 3 days. He had a cold early November and started feeling better. 3 days ago his ears started hurting him. Right was worse than left for a few days then left worse than right. No ear discharge. No fever, chills, sinus pressure, ST, headache. He did feel like his neck was swollen and tight. He has tried sudafed and does seem to help. He admits to feeling some better today.   .. Active Ambulatory Problems    Diagnosis Date Noted  . Asthma 07/09/2013  . Finger pain, right 05/18/2016  . Knee pain, bilateral 05/18/2016  . 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 Class 2 obesity due to excess calories without serious comorbidity in adult 06/02/2016  . Essential hypertension 08/20/2016  . Varicocele 08/20/2016  . Left testicular pain 08/20/2016  . Precordial chest pain 04/27/2017  . Mild intermittent asthma with exacerbation 04/27/2017  . Seasonal allergies 04/27/2017  . White coat syndrome with diagnosis of hypertension 05/18/2017   Resolved Ambulatory Problems    Diagnosis Date Noted  . No Resolved Ambulatory Problems   Past Medical History:  Diagnosis Date  . Asthma 07/09/2013      Review of Systems  All other systems reviewed and are negative.      Objective:   Physical Exam  Constitutional: He is oriented to person, place, and time. He appears well-developed and well-nourished.  HENT:  Head: Normocephalic and atraumatic.  Right Ear: External ear normal.  Left Ear: External ear normal.  Nose: Nose normal.  Mouth/Throat: No oropharyngeal exudate.  TM's clear.  No sinus tenderness to palpation.  Oropharynx a little erythematous without any tonsilar swelling or exudate.   Eyes: Conjunctivae are normal. Right eye exhibits no discharge. Left eye exhibits no discharge.  Neck: Normal  range of motion. Neck supple.  Cardiovascular: Normal rate, regular rhythm and normal heart sounds.  Pulmonary/Chest: Effort normal and breath sounds normal. He has no wheezes.  Lymphadenopathy:    He has no cervical adenopathy.  Neurological: He is alert and oriented to person, place, and time.  Psychiatric: He has a normal mood and affect. His behavior is normal.          Assessment & Plan:  Marland Kitchen.Marland Kitchen.Juan Bryan was seen today for otitis media and neck pain.  Diagnoses and all orders for this visit:  Eustachian tube dysfunction, bilateral  White coat syndrome with diagnosis of hypertension   Offered prednisone and pt declined. Offered solumedrol shot and patient declined. Start flonase daily. Chew gum. HO given. Follow up if symptoms worsen, develop a fever,or get more sinus pressure.  Reassured I see no signs of infection today.   Pt is checking BP's at home and ranging 120 to 130's over 70's to 80's.

## 2017-12-12 IMAGING — DX DG KNEE COMPLETE 4+V*R*
5 series · 5 of 5 positions shown · non-contrast
Comparison: None.

CLINICAL DATA: Chronic bilateral knee pain.

EXAM:
RIGHT KNEE - COMPLETE 4+ VIEW

[knee lat (1 of 2)]
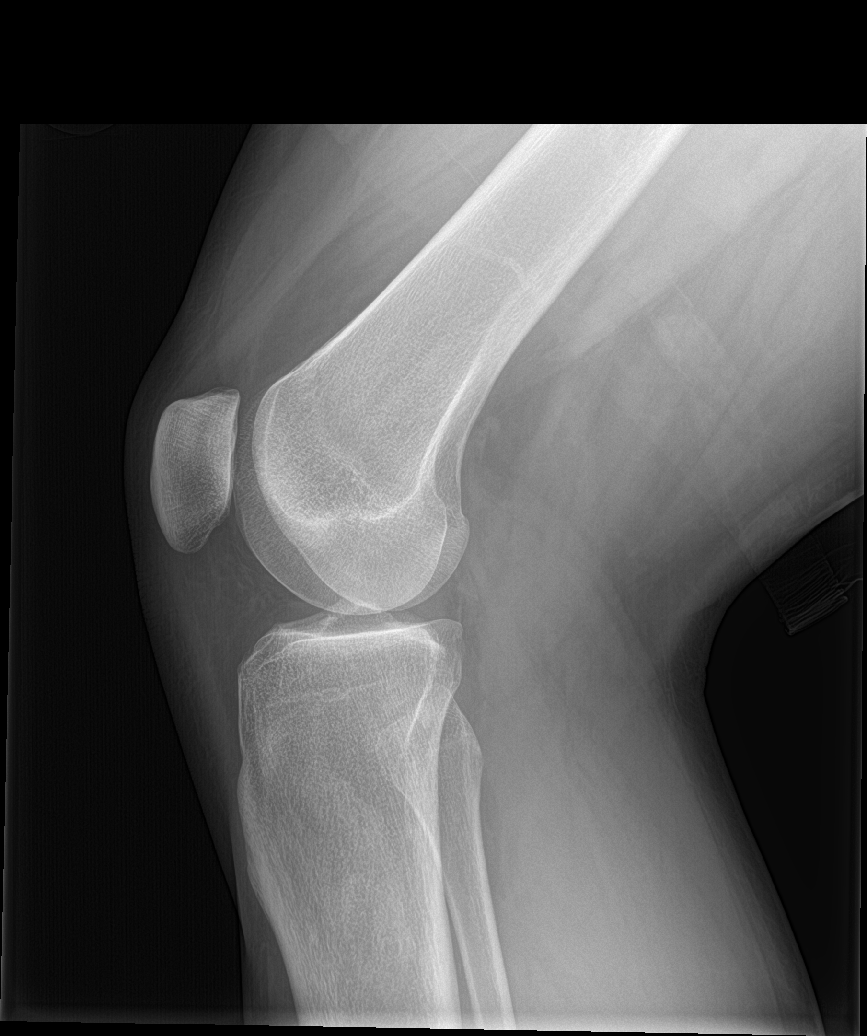

[knee sunrise]
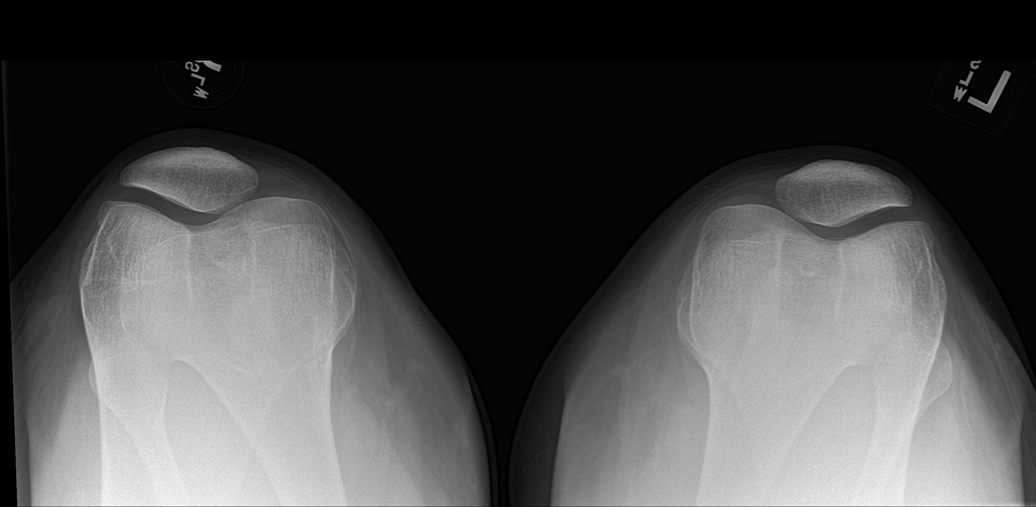

[knee ap bilat standing (1 of 2)]
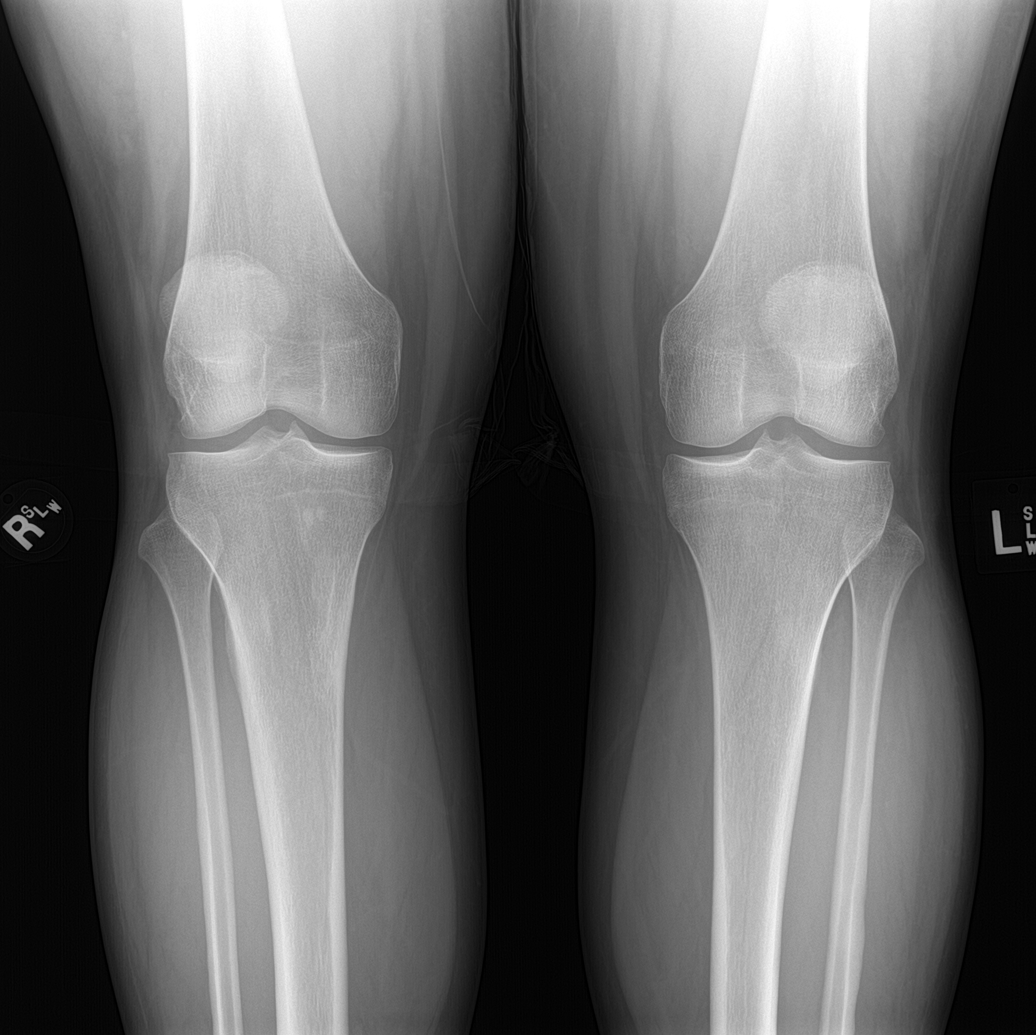

[knee ap bilat standing (2 of 2)]
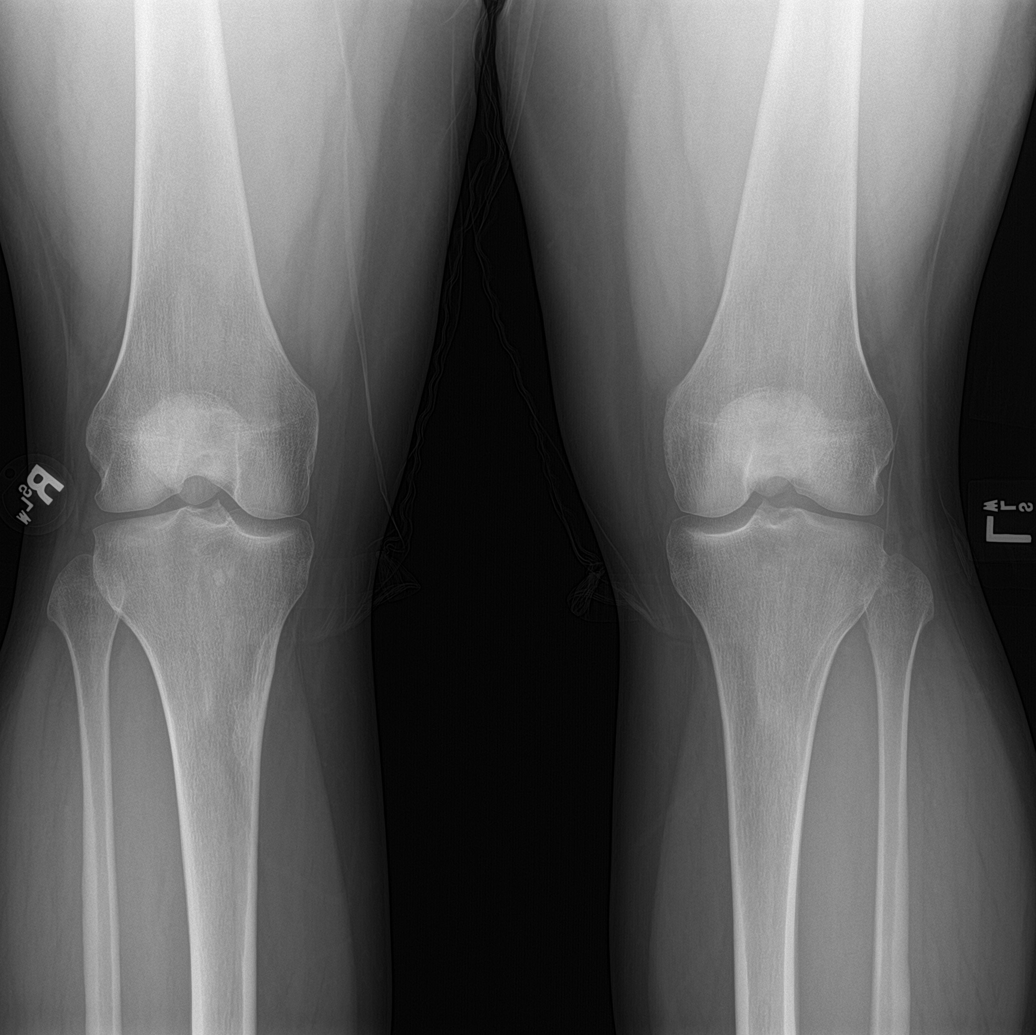

[knee lat (2 of 2)]
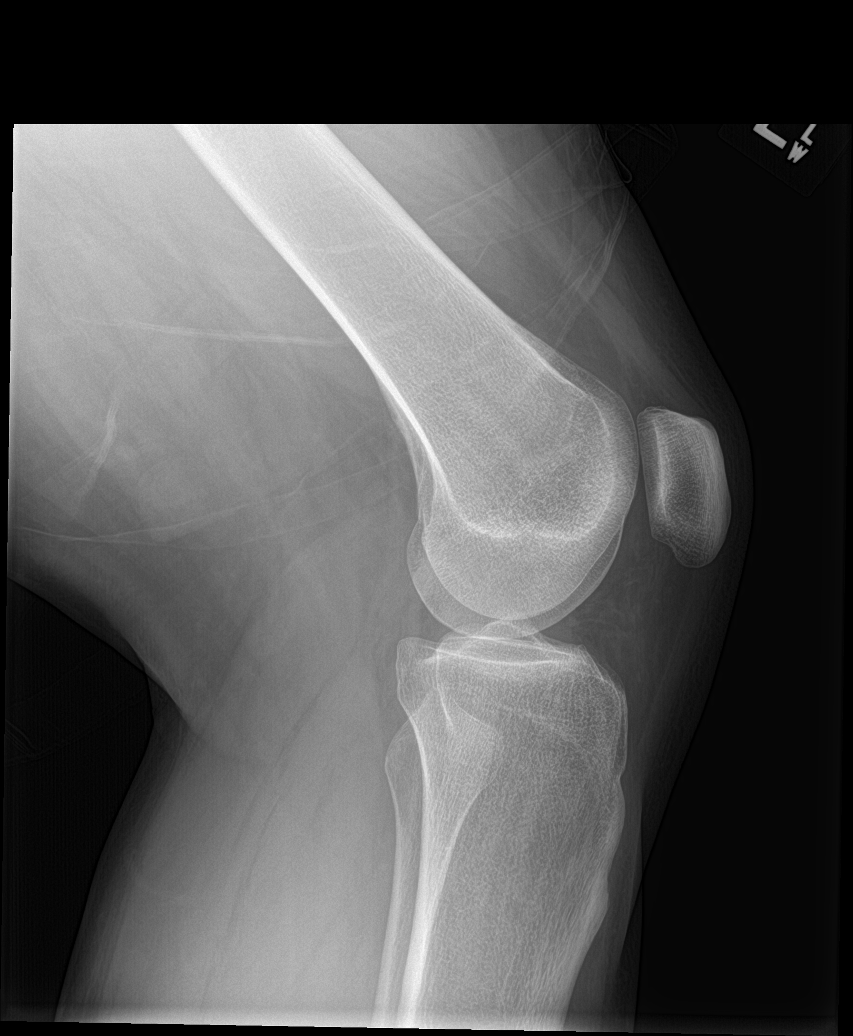

[5 of 5 positions shown; findings below may reference images not displayed]

FINDINGS: No evidence of fracture, dislocation, or joint effusion. No evidence
of arthropathy or other focal bone abnormality. Soft tissues are
unremarkable.
IMPRESSION: Normal right knee.

## 2017-12-12 IMAGING — DX DG FINGER LITTLE 2+V*R*
3 series · 3 of 3 positions shown · non-contrast
Comparison: None in PACs

CLINICAL DATA: Chronic right fifth finger discomfort after a
basketball injury 6 months ago.

EXAM:
RIGHT LITTLE FINGER 2+V

[finger ap]
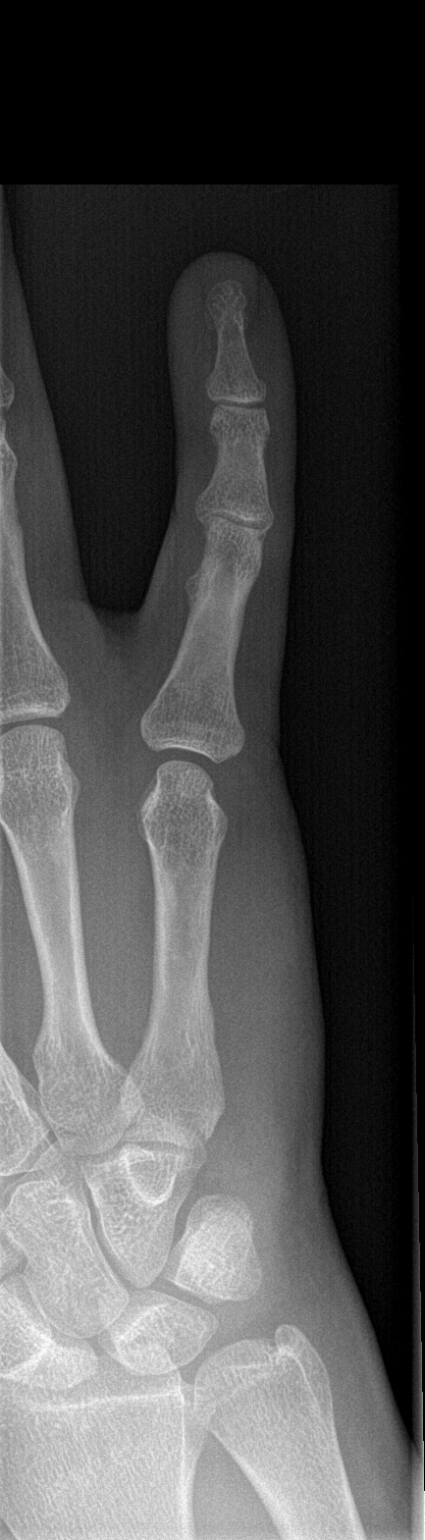

[finger obl]
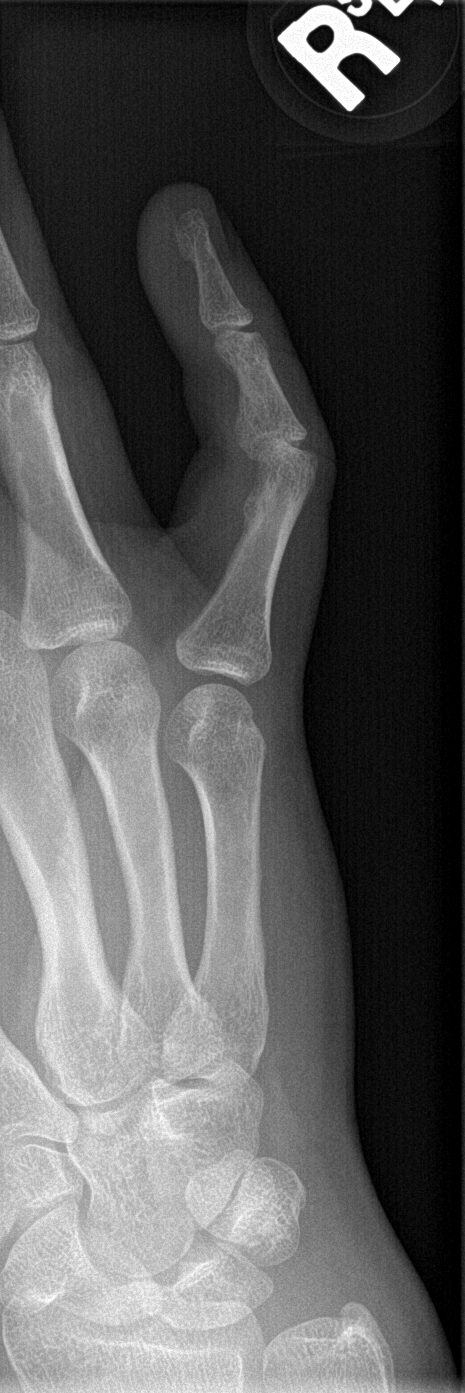

[finger lat]
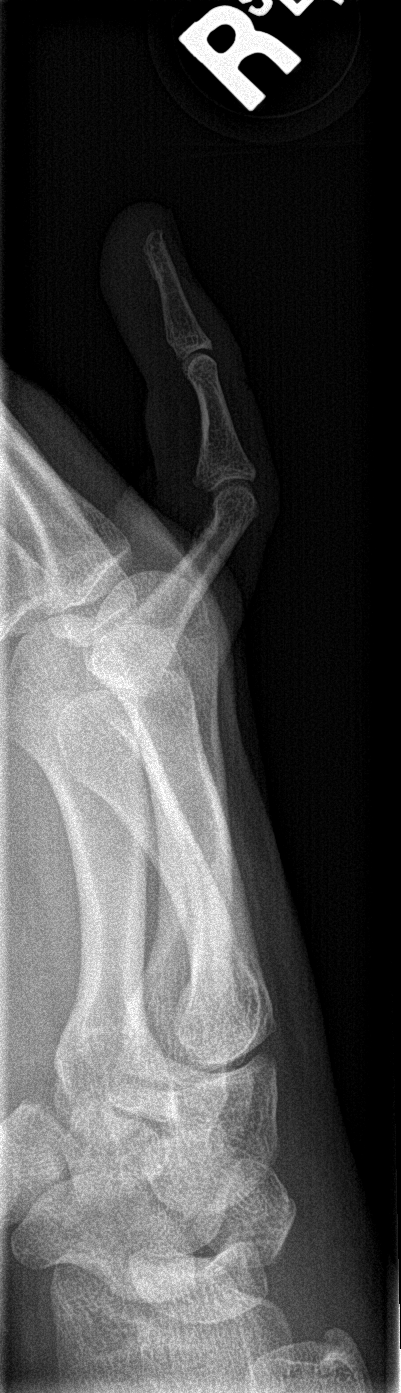

[3 of 3 positions shown; findings below may reference images not displayed]

FINDINGS: The bones are subjectively adequately mineralized. There is a bony
fragment adjacent to the ventral aspect of the base of the middle
phalanx. This is consistent with an prior avulsion fracture at the
volar plate. The IP joint spaces are preserved. The MCP joint space
is preserved. The soft tissues are unremarkable.
IMPRESSION: Avulsion from the ventral aspect of the base of the middle phalanx
of the fifth finger.

## 2017-12-20 ENCOUNTER — Ambulatory Visit: Payer: BC Managed Care – PPO | Admitting: Physician Assistant

## 2017-12-20 ENCOUNTER — Encounter: Payer: Self-pay | Admitting: Physician Assistant

## 2017-12-20 VITALS — BP 164/96 | HR 77 | Temp 97.8°F | Resp 16 | Ht 74.0 in | Wt 282.0 lb

## 2017-12-20 DIAGNOSIS — I10 Essential (primary) hypertension: Secondary | ICD-10-CM

## 2017-12-20 DIAGNOSIS — G478 Other sleep disorders: Secondary | ICD-10-CM | POA: Diagnosis not present

## 2017-12-20 DIAGNOSIS — Z131 Encounter for screening for diabetes mellitus: Secondary | ICD-10-CM

## 2017-12-20 DIAGNOSIS — Z13220 Encounter for screening for lipoid disorders: Secondary | ICD-10-CM | POA: Diagnosis not present

## 2017-12-20 DIAGNOSIS — R5383 Other fatigue: Secondary | ICD-10-CM | POA: Insufficient documentation

## 2017-12-20 DIAGNOSIS — R0683 Snoring: Secondary | ICD-10-CM | POA: Insufficient documentation

## 2017-12-20 MED ORDER — LISINOPRIL 10 MG PO TABS: 10.0000 mg | ORAL_TABLET | Freq: Every day | ORAL | 1 refills | Status: DC

## 2017-12-20 NOTE — Progress Notes (Signed)
Subjective:    Patient ID: Juan Bryan, male    DOB: 1990-10-08, 27 y.o.   MRN: [id number]  HPI  Pt is a 27 yo male with HTN who presents to the clinic to discuss sleep. His girlfriend has stated he talks in sleeps, snores, and even stops breathing for since she has known him. He does notice that he is not rested when he gets up in the morning.  Overall his job is not very stressful and he works only 8 hours a day.  He just feels like when he gets home he is very tired.  He denies any chest pains, palpitations, headaches or vision changes.  He admits he has not started the lisinopril given at last visit.  He has a strong family history for diabetes and hypertension.  He is not on any certain diet but he does try to exercise occasionally.  .. Active Ambulatory Problems    Diagnosis Date Noted  . Asthma 07/09/2013  . Finger pain, right 05/18/2016  . Knee pain, bilateral 05/18/2016  . 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 Class 2 obesity due to excess calories without serious comorbidity in adult 06/02/2016  . Essential hypertension 08/20/2016  . Varicocele 08/20/2016  . Left testicular pain 08/20/2016  . Precordial chest pain 04/27/2017  . Mild intermittent asthma with exacerbation 04/27/2017  . Seasonal allergies 04/27/2017  . White coat syndrome with diagnosis of hypertension 05/18/2017  . Non-restorative sleep 12/20/2017  . Sleep talking 12/20/2017  . Snoring 12/20/2017  . No energy 12/20/2017   Resolved Ambulatory Problems    Diagnosis Date Noted  . No Resolved Ambulatory Problems   Past Medical History:  Diagnosis Date  . Asthma 07/09/2013      Review of Systems  All other systems reviewed and are negative.      Objective:   Physical Exam  Constitutional: He is oriented to person, place, and time. He appears well-developed and well-nourished.  HENT:  Head: Normocephalic and atraumatic.  Nose: Nose normal.  Mouth/Throat: Oropharynx is clear and  moist. No oropharyngeal exudate.  Eyes: Pupils are equal, round, and reactive to light. Conjunctivae and EOM are normal.  Neck: Normal range of motion. Neck supple.  Cardiovascular: Normal rate and regular rhythm.  Pulmonary/Chest: Effort normal and breath sounds normal. He has no wheezes.  Lymphadenopathy:    He has no cervical adenopathy.  Neurological: He is alert and oriented to person, place, and time.  Skin: Skin is warm.  Psychiatric: He has a normal mood and affect. His behavior is normal.          Assessment & Plan:  Marland KitchenMarland KitchenZakariah was seen today for possible sleep apnea and hypertension.  Diagnoses and all orders for this visit:  Non-restorative sleep -     TSH -     Home sleep test  Essential hypertension -     lisinopril (PRINIVIL,ZESTRIL) 10 MG tablet; Take 1 tablet (10 mg total) by mouth daily. For blood pressure control. -     COMPLETE METABOLIC PANEL WITH GFR  Screening for lipid disorders -     Lipid Panel w/reflex Direct LDL  Screening for diabetes mellitus -     COMPLETE METABOLIC PANEL WITH GFR  No energy -     TSH -     B12 and Folate Panel -     VITAMIN D 25 Hydroxy (Vit-D Deficiency, Fractures) -     Home sleep test  Snoring -  TSH -     Home sleep test  Sleep talking -     Home sleep test   .. Depression screen PHQ 2/9 12/20/2017  Decreased Interest 1  Down, Depressed, Hopeless 2  PHQ - 2 Score 3  Altered sleeping 0  Tired, decreased energy 3  Change in appetite 2  Feeling bad or failure about yourself  2  Trouble concentrating 2  Moving slowly or fidgety/restless 0  Suicidal thoughts 0  PHQ-9 Score 12  Difficult doing work/chores Not difficult at all   Blood pressure is very elevated today.  Patient has not been taking his medication.  Patient reports he will start lisinopril and have a blood pressure recheck in 4 weeks.  Discussed the importance of blood pressure control.  Patient certainly has multiple risk factors for sleep  apnea.  His stop bang score was positive with 7 yes answers.  We will get set up with a home or in lab study which ever insurance will pay for.  Patient does prefer home study.  Certainly this could be a contributor factor to his hypertension, energy, mood.  Discussed overall good sleep hygiene.  We certainly could consider medications for better restful sleep in the near future.  We will get some labs to evaluate cholesterol, liver, kidney, energy.  Patient declines any problem with his depression.  We will certainly follow this closely.  Patient is aware if he has any suicidal or homicidal thoughts to please follow-up with office.

## 2017-12-21 NOTE — Progress Notes (Signed)
Call pt: cholesterol looks great. Kidney, liver look good. TSH great. b12 wonderful. Vitamin d normal but low normal. Consider 1000units of D3 daily.

## 2018-02-09 ENCOUNTER — Other Ambulatory Visit: Payer: Self-pay | Admitting: Physician Assistant

## 2018-02-09 DIAGNOSIS — I10 Essential (primary) hypertension: Secondary | ICD-10-CM

## 2018-04-04 LAB — COMPLETE METABOLIC PANEL WITH GFR
AG Ratio: 2.1 (calc) (ref 1.0–2.5)
ALBUMIN MSPROF: 4.6 g/dL (ref 3.6–5.1)
ALT: 19 U/L (ref 9–46)
AST: 15 U/L (ref 10–40)
Alkaline phosphatase (APISO): 69 U/L (ref 40–115)
BUN: 14 mg/dL (ref 7–25)
CO2: 28 mmol/L (ref 20–32)
CREATININE: 1 mg/dL (ref 0.60–1.35)
Calcium: 9.5 mg/dL (ref 8.6–10.3)
Chloride: 106 mmol/L (ref 98–110)
GFR, EST AFRICAN AMERICAN: 119 mL/min/{1.73_m2} (ref >=60)
GFR, EST NON AFRICAN AMERICAN: 103 mL/min/{1.73_m2} (ref >=60)
GLUCOSE: 99 mg/dL (ref 65–99)
Globulin: 2.2 g/dL (calc) (ref 1.9–3.7)
Potassium: 4.5 mmol/L (ref 3.5–5.3)
Sodium: 141 mmol/L (ref 135–146)
TOTAL PROTEIN: 6.8 g/dL (ref 6.1–8.1)
Total Bilirubin: 0.6 mg/dL (ref 0.2–1.2)

## 2018-04-04 LAB — TSH: TSH: 1.04 mIU/L (ref 0.40–4.50)

## 2018-04-04 LAB — B12 AND FOLATE PANEL
FOLATE: 11.4 ng/mL
Vitamin B-12: 779 pg/mL (ref 200–1100)

## 2018-04-04 LAB — LIPID PANEL W/REFLEX DIRECT LDL
Cholesterol: 141 mg/dL (ref <200)
HDL: 43 mg/dL (ref >40)
LDL Cholesterol (Calc): 84 mg/dL (calc)
Non-HDL Cholesterol (Calc): 98 mg/dL (calc) (ref <130)
TRIGLYCERIDES: 48 mg/dL (ref <150)
Total CHOL/HDL Ratio: 3.3 (calc) (ref <5.0)

## 2018-04-04 LAB — VITAMIN D 25 HYDROXY (VIT D DEFICIENCY, FRACTURES): VIT D 25 HYDROXY: 33 ng/mL (ref 30–100)

## 2018-04-07 ENCOUNTER — Encounter: Payer: Self-pay | Admitting: Physician Assistant

## 2018-04-07 ENCOUNTER — Ambulatory Visit: Payer: BC Managed Care – PPO | Admitting: Physician Assistant

## 2018-04-07 VITALS — BP 130/80 | HR 99 | Wt 255.0 lb

## 2018-04-07 DIAGNOSIS — M542 Cervicalgia: Secondary | ICD-10-CM | POA: Diagnosis not present

## 2018-04-07 DIAGNOSIS — Z23 Encounter for immunization: Secondary | ICD-10-CM

## 2018-04-07 DIAGNOSIS — G44209 Tension-type headache, unspecified, not intractable: Secondary | ICD-10-CM | POA: Diagnosis not present

## 2018-04-07 DIAGNOSIS — M6289 Other specified disorders of muscle: Secondary | ICD-10-CM | POA: Diagnosis not present

## 2018-04-07 DIAGNOSIS — I10 Essential (primary) hypertension: Secondary | ICD-10-CM

## 2018-04-07 MED ORDER — LISINOPRIL 10 MG PO TABS
10.0000 mg | ORAL_TABLET | Freq: Every day | ORAL | 3 refills | Status: DC
Start: 2018-04-07 — End: 2019-04-12

## 2018-04-07 MED ORDER — KETOROLAC TROMETHAMINE 60 MG/2ML IM SOLN
60.0000 mg | Freq: Once | INTRAMUSCULAR | Status: AC
  Administered 2018-04-07: 60 mg via INTRAMUSCULAR

## 2018-04-07 MED ORDER — CYCLOBENZAPRINE HCL 10 MG PO TABS: 10.0000 mg | ORAL_TABLET | Freq: Three times a day (TID) | ORAL | 0 refills | Status: DC | PRN

## 2018-04-07 NOTE — Progress Notes (Signed)
   Subjective:    Patient ID: Juan Bryan, male    DOB: 11/12/1990, 27 y.o.   MRN: [id number]  HPI  Pt is a 27 yo male with HTN, asthma who presents to the clinic with headache that concerned him since Wednesday.   HTN- he was started on lisinopril and feeling so much better since started. He needs refill.   HA started 2 days ago and a lot of pressure in head and neck. He reports chills and sweating rapidly. He did have some dizziness. No blurred vision, nausea. He did have some light and sound sensitivity. He took some sudafed with some relief. He does feel better today. His headache is not present but his neck pain and stiffness is.   .. Active Ambulatory Problems    Diagnosis Date Noted  . Asthma 07/09/2013  . Finger pain, right 05/18/2016  . Knee pain, bilateral 05/18/2016  . 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 Class 2 obesity due to excess calories without serious comorbidity in adult 06/02/2016  . Essential hypertension 08/20/2016  . Varicocele 08/20/2016  . Left testicular pain 08/20/2016  . Precordial chest pain 04/27/2017  . Mild intermittent asthma with exacerbation 04/27/2017  . Seasonal allergies 04/27/2017  . White coat syndrome with diagnosis of hypertension 05/18/2017  . Non-restorative sleep 12/20/2017  . Sleep talking 12/20/2017  . Snoring 12/20/2017  . No energy 12/20/2017   Resolved Ambulatory Problems    Diagnosis Date Noted  . No Resolved Ambulatory Problems   No Additional Past Medical History      Review of Systems  All other systems reviewed and are negative.      Objective:   Physical Exam  Constitutional: He is oriented to person, place, and time. He appears well-developed and well-nourished.  HENT:  Head: Normocephalic and atraumatic.  Eyes: Conjunctivae are normal.  Neck: Normal range of motion. Neck supple.  Cardiovascular: Normal rate and regular rhythm.  Pulmonary/Chest: Effort normal and breath sounds normal.  He has no wheezes.  Musculoskeletal:  NROM of neck.  Tightness over upper shoulder back and into neck.   Neurological: He is alert and oriented to person, place, and time.  Psychiatric: He has a normal mood and affect. His behavior is normal.          Assessment & Plan:  Marland KitchenMarland KitchenDiagnoses and all orders for this visit:  Neck pain -     ketorolac (TORADOL) injection 60 mg  Need for immunization against influenza -     Flu Vaccine QUAD 36+ mos IM  Muscle tightness -     ketorolac (TORADOL) injection 60 mg  Acute non intractable tension-type headache -     ketorolac (TORADOL) injection 60 mg  Essential hypertension -     lisinopril (PRINIVIL,ZESTRIL) 10 MG tablet; Take 1 tablet (10 mg total) by mouth daily. For blood pressure control.  Other orders -     cyclobenzaprine (FLEXERIL) 10 MG tablet; Take 1 tablet (10 mg total) by mouth 3 (three) times daily as needed for muscle spasms.   BP looks better. Sent refills.   Could be a viral etiology since sounds like having some chills and night sweats. Likely clearing. Stay hydrated and rest.   Neck pain could be causing headache. toradol given in office today. Flexeril at bedtime. Sedation warning given. Encouraged massage, heat, biofreeze. Follow up as needed. NSAIDs as needed.

## 2018-04-07 NOTE — Patient Instructions (Signed)
Neck Exercises Neck exercises can be important for many reasons:  They can help you to improve and maintain flexibility in your neck. This can be especially important as you age.  They can help to make your neck stronger. This can make movement easier.  They can reduce or prevent neck pain.  They may help your upper back.  Ask your health care provider which neck exercises would be best for you. Exercises Neck Press Repeat this exercise 10 times. Do it first thing in the morning and right before bed or as told by your health care provider. 1. Lie on your back on a firm bed or on the floor with a pillow under your head. 2. Use your neck muscles to push your head down on the pillow and straighten your spine. 3. Hold the position as well as you can. Keep your head facing up and your chin tucked. 4. Slowly count to 5 while holding this position. 5. Relax for a few seconds. Then repeat.  Isometric Strengthening Do a full set of these exercises 2 times a day or as told by your health care provider. 1. Sit in a supportive chair and place your hand on your forehead. 2. Push forward with your head and neck while pushing back with your hand. Hold for 10 seconds. 3. Relax. Then repeat the exercise 3 times. 4. Next, do thesequence again, this time putting your hand against the back of your head. Use your head and neck to push backward against the hand pressure. 5. Finally, do the same exercise on either side of your head, pushing sideways against the pressure of your hand.  Prone Head Lifts Repeat this exercise 5 times. Do this 2 times a day or as told by your health care provider. 1. Lie face-down, resting on your elbows so that your chest and upper back are raised. 2. Start with your head facing downward, near your chest. Position your chin either on or near your chest. 3. Slowly lift your head upward. Lift until you are looking straight ahead. Then continue lifting your head as far back as  you can stretch. 4. Hold your head up for 5 seconds. Then slowly lower it to your starting position.  Supine Head Lifts Repeat this exercise 8-10 times. Do this 2 times a day or as told by your health care provider. 1. Lie on your back, bending your knees to point to the ceiling and keeping your feet flat on the floor. 2. Lift your head slowly off the floor, raising your chin toward your chest. 3. Hold for 5 seconds. 4. Relax and repeat.  Scapular Retraction Repeat this exercise 5 times. Do this 2 times a day or as told by your health care provider. 1. Stand with your arms at your sides. Look straight ahead. 2. Slowly pull both shoulders backward and downward until you feel a stretch between your shoulder blades in your upper back. 3. Hold for 10-30 seconds. 4. Relax and repeat.  Contact a health care provider if:  Your neck pain or discomfort gets much worse when you do an exercise.  Your neck pain or discomfort does not improve within 2 hours after you exercise. If you have any of these problems, stop exercising right away. Do not do the exercises again unless your health care provider says that you can. Get help right away if:  You develop sudden, severe neck pain. If this happens, stop exercising right away. Do not do the exercises again unless your   health care provider says that you can. Exercises Neck Stretch  Repeat this exercise 3-5 times. 1. Do this exercise while standing or while sitting in a chair. 2. Place your feet flat on the floor, shoulder-width apart. 3. Slowly turn your head to the right. Turn it all the way to the right so you can look over your right shoulder. Do not tilt or tip your head. 4. Hold this position for 10-30 seconds. 5. Slowly turn your head to the left, to look over your left shoulder. 6. Hold this position for 10-30 seconds.  Neck Retraction Repeat this exercise 8-10 times. Do this 3-4 times a day or as told by your health care  provider. 1. Do this exercise while standing or while sitting in a sturdy chair. 2. Look straight ahead. Do not bend your neck. 3. Use your fingers to push your chin backward. Do not bend your neck for this movement. Continue to face straight ahead. If you are doing the exercise properly, you will feel a slight sensation in your throat and a stretch at the back of your neck. 4. Hold the stretch for 1-2 seconds. Relax and repeat.  This information is not intended to replace advice given to you by your health care provider. Make sure you discuss any questions you have with your health care provider. Document Released: 06/02/2015 Document Revised: 11/27/2015 Document Reviewed: 12/30/2014 Elsevier Interactive Patient Education  2018 [employer]vier Inc.  

## 2018-04-10 ENCOUNTER — Ambulatory Visit: Payer: BC Managed Care – PPO | Admitting: Physician Assistant

## 2018-12-19 ENCOUNTER — Telehealth: Payer: Self-pay | Admitting: Neurology

## 2018-12-19 NOTE — Telephone Encounter (Signed)
Patient made aware letter written. He will print from [facility] and call with any issues.

## 2018-12-19 NOTE — Telephone Encounter (Signed)
Patient left vm requesting a note for jury duty stating due to Covid and his chronic asthma/breathing issues he would like to be excused. Please advise if okay to write?  He would like this emailed to [email or web address] if possible.

## 2018-12-19 NOTE — Telephone Encounter (Signed)
Yes can write out due to higher risk of complications due to underlying medical conditions.

## 2019-04-12 ENCOUNTER — Other Ambulatory Visit: Payer: Self-pay | Admitting: Physician Assistant

## 2019-04-12 DIAGNOSIS — I10 Essential (primary) hypertension: Secondary | ICD-10-CM

## 2019-05-22 ENCOUNTER — Telehealth: Payer: BC Managed Care – PPO | Admitting: Emergency Medicine

## 2019-05-22 DIAGNOSIS — R059 Cough, unspecified: Secondary | ICD-10-CM

## 2019-05-22 DIAGNOSIS — R05 Cough: Secondary | ICD-10-CM

## 2019-05-22 DIAGNOSIS — R0602 Shortness of breath: Secondary | ICD-10-CM

## 2019-05-22 NOTE — Progress Notes (Signed)
  E-Visit for [employer] Virus Screening  Based on what you have shared with me, you need to seek an evaluation for a severe illness that is causing your symptoms which may be coronavirus or some other illness. I recommend that you be seen and evaluated "face to face".   Because of your shortness of breath symptoms, you need to be seen in person where a pulse oximeter can be used to check your oxygen level.  You may also need a chest x-ray.    Please go to any of the locations below.  They will also be able to test you for COVID-19.  If you are considered high risk for Corona virus because of a known exposure, fever, shortness of breath and cough, OR if you have severe symptoms of any kind, seek medical care at an emergency room. Our Emergency Departments are best equipped to handle patients with severe symptoms.  You will be evaluated by the ER provider (or higher level of care provider) who will determine whether you need formal testing.  If you are having a true medical emergency please call 911.   I recommend the following:  . [facility] Emergency Department [place], [place], [place] [address] [phone]  . [facility] Emergency Department [place], [place], [place] [address] [phone]  . [facility] Emergency Department [place], [place], [place] [address] [phone]  . [facility] Emergency Department [address] [place], [place], [place] [address] [phone]  . [facility] Emergency Department [place], [place], [place] [address] [phone]  NOTE: If you entered your credit card information for this eVisit, you will not be charged. You may see a "hold" on your card for the $35 but that hold will drop off and you will not have a charge processed.   Your e-visit answers were reviewed by a board certified advanced clinical practitioner  to complete your personal care plan.  Thank you for using e-Visits.

## 2019-05-25 ENCOUNTER — Other Ambulatory Visit: Payer: Self-pay | Admitting: Physician Assistant

## 2019-05-25 DIAGNOSIS — I10 Essential (primary) hypertension: Secondary | ICD-10-CM

## 2019-05-27 ENCOUNTER — Other Ambulatory Visit: Payer: Self-pay | Admitting: Physician Assistant

## 2019-05-27 DIAGNOSIS — I10 Essential (primary) hypertension: Secondary | ICD-10-CM

## 2019-05-28 MED ORDER — LISINOPRIL 10 MG PO TABS: 10.0000 mg | ORAL_TABLET | Freq: Every day | ORAL | 0 refills | Status: DC

## 2019-06-11 ENCOUNTER — Encounter: Payer: Self-pay | Admitting: Physician Assistant

## 2019-06-11 ENCOUNTER — Ambulatory Visit (INDEPENDENT_AMBULATORY_CARE_PROVIDER_SITE_OTHER): Payer: BC Managed Care – PPO | Admitting: Physician Assistant

## 2019-06-11 VITALS — BP 136/87 | HR 70 | Ht 74.0 in | Wt 228.0 lb

## 2019-06-11 DIAGNOSIS — R0789 Other chest pain: Secondary | ICD-10-CM

## 2019-06-11 DIAGNOSIS — J4521 Mild intermittent asthma with (acute) exacerbation: Secondary | ICD-10-CM

## 2019-06-11 DIAGNOSIS — I10 Essential (primary) hypertension: Secondary | ICD-10-CM

## 2019-06-11 MED ORDER — LISINOPRIL 10 MG PO TABS: 10.0000 mg | ORAL_TABLET | Freq: Every day | ORAL | 4 refills | Status: DC

## 2019-06-11 NOTE — Progress Notes (Signed)
Patient ID: Juan Bryan, male   DOB: July 08, 1990, 28 y.o.   MRN: [id number] .Marland KitchenVirtual Visit via Video Note  I connected with Juan Bryan on 06/11/19 at  8:50 AM EST by a video enabled telemedicine application and verified that I am speaking with the correct person using two identifiers.  Location: Patient: home Provider: clinic   I discussed the limitations of evaluation and management by telemedicine and the availability of in person appointments. The patient expressed understanding and agreed to proceed.  History of Present Illness: Patient is a 28 year old male with asthma and hypertension who calls into the clinic for medication refills.  Patient is doing well on lisinopril daily.  His blood pressure has been well controlled.  He does check it occasionally.  He has had a particularly stressful week and feels like it is up even from what he normally checks that I have.  He denies any palpitations, headaches, vision changes.  He has had a few episodes of chest tightness mostly with sexual activity.  He denies any cough.  He does have intermittent shortness of breath at times.  He intermittently uses his albuterol inhaler.  He denies any chest pain or tightness with exercise.  Chest tightness does seem to go away with rest or albuterol use  .Marland Kitchen Active Ambulatory Problems    Diagnosis Date Noted  . Asthma 07/09/2013  . Finger pain, right 05/18/2016  . Knee pain, bilateral 05/18/2016  . 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 Class 2 obesity due to excess calories without serious comorbidity in adult 06/02/2016  . Essential hypertension 08/20/2016  . Varicocele 08/20/2016  . Left testicular pain 08/20/2016  . Precordial chest pain 04/27/2017  . Mild intermittent asthma with exacerbation 04/27/2017  . Seasonal allergies 04/27/2017  . White coat syndrome with diagnosis of hypertension 05/18/2017  . Non-restorative sleep 12/20/2017  . Sleep talking 12/20/2017  .  Snoring 12/20/2017  . No energy 12/20/2017   Resolved Ambulatory Problems    Diagnosis Date Noted  . No Resolved Ambulatory Problems   No Additional Past Medical History   Reviewed med, allergy, problem list.     Observations/Objective: No acute distress.  Normal appearance and mood.  No cough, labored breathing or wheezing.   .. Today's Vitals   06/11/19 0834  BP: 136/87  Pulse: 70  Weight: 228 lb (103.4 kg)  Height: 6\' 2"  (1.88 m)   Body mass index is 29.27 kg/m.    Assessment and Plan: Marland KitchenMarland KitchenDiagnoses and all orders for this visit:  Essential hypertension -     lisinopril (ZESTRIL) 10 MG tablet; Take 1 tablet (10 mg total) by mouth daily. -     COMPLETE METABOLIC PANEL WITH GFR  Mild intermittent asthma with exacerbation  Feeling of chest tightness   Blood pressure under goal of 140/90.  Refilled lisinopril for 1 year.  Ordered CMP for medication management.  After discussing chest tightness I think it could be related to his asthma.  Encouraged 2 puffs of albuterol 15 to 20 minutes before sexual activity or exercise.  If this does not help and/or chest pain/tightness continues we could consider a stress test.  His last LDL was a year ago and looked great.  Follow-up as needed. He does not need albuterol refill.    Follow Up Instructions:    I discussed the assessment and treatment plan with the patient. The patient was provided an opportunity to ask questions and all were answered. The patient agreed with the  plan and demonstrated an understanding of the instructions.   The patient was advised to call back or seek an in-person evaluation if the symptoms worsen or if the condition fails to improve as anticipated.    Tandy Gaw, PA-C

## 2020-02-29 DIAGNOSIS — Z03818 Encounter for observation for suspected exposure to other biological agents ruled out: Secondary | ICD-10-CM | POA: Diagnosis not present

## 2020-03-02 DIAGNOSIS — Z20828 Contact with and (suspected) exposure to other viral communicable diseases: Secondary | ICD-10-CM | POA: Diagnosis not present

## 2020-03-24 ENCOUNTER — Encounter: Payer: Self-pay | Admitting: Physician Assistant

## 2020-03-24 ENCOUNTER — Other Ambulatory Visit: Payer: Self-pay

## 2020-03-24 ENCOUNTER — Ambulatory Visit (INDEPENDENT_AMBULATORY_CARE_PROVIDER_SITE_OTHER): Payer: BC Managed Care – PPO | Admitting: Physician Assistant

## 2020-03-24 DIAGNOSIS — S060X0A Concussion without loss of consciousness, initial encounter: Secondary | ICD-10-CM | POA: Diagnosis not present

## 2020-03-24 DIAGNOSIS — S199XXA Unspecified injury of neck, initial encounter: Secondary | ICD-10-CM

## 2020-03-24 DIAGNOSIS — S139XXA Sprain of joints and ligaments of unspecified parts of neck, initial encounter: Secondary | ICD-10-CM | POA: Diagnosis not present

## 2020-03-24 MED ORDER — IBUPROFEN 800 MG PO TABS: 800.0000 mg | ORAL_TABLET | Freq: Three times a day (TID) | ORAL | 0 refills | Status: DC | PRN

## 2020-03-24 MED ORDER — KETOROLAC TROMETHAMINE 60 MG/2ML IM SOLN
60.0000 mg | Freq: Once | INTRAMUSCULAR | Status: AC
  Administered 2020-03-24: 60 mg via INTRAMUSCULAR

## 2020-03-24 MED ORDER — CYCLOBENZAPRINE HCL 10 MG PO TABS: 10.0000 mg | ORAL_TABLET | Freq: Three times a day (TID) | ORAL | 0 refills | Status: DC | PRN

## 2020-03-24 NOTE — Patient Instructions (Signed)
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
## 2020-03-27 ENCOUNTER — Encounter: Payer: Self-pay | Admitting: Physician Assistant

## 2020-03-31 ENCOUNTER — Other Ambulatory Visit: Payer: Self-pay

## 2020-03-31 ENCOUNTER — Ambulatory Visit (INDEPENDENT_AMBULATORY_CARE_PROVIDER_SITE_OTHER): Payer: BC Managed Care – PPO

## 2020-03-31 ENCOUNTER — Ambulatory Visit (INDEPENDENT_AMBULATORY_CARE_PROVIDER_SITE_OTHER): Payer: BC Managed Care – PPO | Admitting: Physician Assistant

## 2020-03-31 ENCOUNTER — Encounter: Payer: Self-pay | Admitting: Physician Assistant

## 2020-03-31 DIAGNOSIS — I10 Essential (primary) hypertension: Secondary | ICD-10-CM

## 2020-03-31 DIAGNOSIS — M5442 Lumbago with sciatica, left side: Secondary | ICD-10-CM

## 2020-03-31 DIAGNOSIS — G8929 Other chronic pain: Secondary | ICD-10-CM | POA: Insufficient documentation

## 2020-03-31 DIAGNOSIS — J4521 Mild intermittent asthma with (acute) exacerbation: Secondary | ICD-10-CM | POA: Diagnosis not present

## 2020-03-31 DIAGNOSIS — F410 Panic disorder [episodic paroxysmal anxiety] without agoraphobia: Secondary | ICD-10-CM | POA: Diagnosis not present

## 2020-03-31 DIAGNOSIS — M5441 Lumbago with sciatica, right side: Secondary | ICD-10-CM | POA: Diagnosis not present

## 2020-03-31 DIAGNOSIS — M62830 Muscle spasm of back: Secondary | ICD-10-CM

## 2020-03-31 DIAGNOSIS — R030 Elevated blood-pressure reading, without diagnosis of hypertension: Secondary | ICD-10-CM

## 2020-03-31 MED ORDER — METHYLPREDNISOLONE SODIUM SUCC 125 MG IJ SOLR
125.0000 mg | Freq: Once | INTRAMUSCULAR | Status: AC
  Administered 2020-03-31: 125 mg via INTRAMUSCULAR

## 2020-03-31 MED ORDER — LORAZEPAM 0.5 MG PO TABS: ORAL_TABLET | ORAL | 0 refills | Status: DC

## 2020-03-31 MED ORDER — ALBUTEROL SULFATE HFA 108 (90 BASE) MCG/ACT IN AERS: 2.0000 | INHALATION_SPRAY | Freq: Four times a day (QID) | RESPIRATORY_TRACT | 1 refills | Status: DC | PRN
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
## 2020-03-31 NOTE — Patient Instructions (Signed)
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
## 2020-04-01 DIAGNOSIS — M62830 Muscle spasm of back: Secondary | ICD-10-CM | POA: Insufficient documentation

## 2020-04-01 NOTE — Progress Notes (Signed)
No acute findings on xray. You do have some curve to your spine in the low back area it is possible that muscle spasm is causing some of that or that your spine was naturally curved that way. Treatment plan stays the same. If no better in next 5 days. Reach back out.

## 2020-04-04 DIAGNOSIS — R030 Elevated blood-pressure reading, without diagnosis of hypertension: Secondary | ICD-10-CM | POA: Insufficient documentation

## 2020-05-25 DIAGNOSIS — Z03818 Encounter for observation for suspected exposure to other biological agents ruled out: Secondary | ICD-10-CM | POA: Diagnosis not present

## 2020-05-25 DIAGNOSIS — Z20822 Contact with and (suspected) exposure to covid-19: Secondary | ICD-10-CM | POA: Diagnosis not present

## 2020-06-06 DIAGNOSIS — M5416 Radiculopathy, lumbar region: Secondary | ICD-10-CM | POA: Diagnosis not present

## 2020-06-17 ENCOUNTER — Encounter: Payer: Self-pay | Admitting: Physician Assistant

## 2020-06-17 ENCOUNTER — Telehealth (INDEPENDENT_AMBULATORY_CARE_PROVIDER_SITE_OTHER): Payer: BC Managed Care – PPO | Admitting: Physician Assistant

## 2020-06-17 VITALS — BP 150/90 | Temp 97.0°F | Ht 74.0 in | Wt 241.0 lb

## 2020-06-17 DIAGNOSIS — J329 Chronic sinusitis, unspecified: Secondary | ICD-10-CM | POA: Diagnosis not present

## 2020-06-17 DIAGNOSIS — J40 Bronchitis, not specified as acute or chronic: Secondary | ICD-10-CM

## 2020-06-17 DIAGNOSIS — I10 Essential (primary) hypertension: Secondary | ICD-10-CM

## 2020-06-17 DIAGNOSIS — J4521 Mild intermittent asthma with (acute) exacerbation: Secondary | ICD-10-CM

## 2020-06-17 DIAGNOSIS — M5442 Lumbago with sciatica, left side: Secondary | ICD-10-CM

## 2020-06-17 DIAGNOSIS — G8929 Other chronic pain: Secondary | ICD-10-CM

## 2020-06-17 MED ORDER — AZITHROMYCIN 250 MG PO TABS: ORAL_TABLET | ORAL | 0 refills | Status: DC

## 2020-06-17 NOTE — Progress Notes (Signed)
Patient ID: Juan Bryan, male   DOB: 04/06/1991, 29 y.o.   MRN: [id number] .Marland KitchenVirtual Visit via Video Note  I connected with Heywood Bene on 06/17/20 at  8:50 AM EST by a video enabled telemedicine application and verified that I am speaking with the correct person using two identifiers.  Location: Patient: home Provider: clinic  .Marland KitchenParticipating in visit:  Patient: Ronnette Juniper Provider: Tandy Gaw PA-C Provider in training: Elizabeth Sauer PA-Student    I discussed the limitations of evaluation and management by telemedicine and the availability of in person appointments. The patient expressed understanding and agreed to proceed.  History of Present Illness: Patient is a 29 year old male with hypertension and chronic low back pain radiating to the left who presents to the clinic to discuss medications.  Patient denies any chest pain, palpitations, headache, vision changes.  He is not checking his blood pressure at home.  He is taking the lisinopril 10 mg.  Patient does continue to have chronic left sided low back pain.  He has seen orthopedic group which wanted him to start physical therapy, meloxicam, gabapentin.  He is concerned about taking these medications and would like to discuss.  He does report that the radiculopathy symptoms have resolved that were going down his left leg.  Patient has been struggling with upper respiratory symptoms for the last 2 to 3 weeks.  He did get a little better and then the congestion felt like it moved to his chest.  Does have a history of asthma.  He has noticed some wheezing and chest tightness.  He has had some sinus pressure and congestion.  He has coughing up green to yellow sputum.  He has tried over-the-counter medications with some initial relief and now he is just not getting any better.    .. Active Ambulatory Problems    Diagnosis Date Noted  . Asthma 07/09/2013  . Finger pain, right 05/18/2016  . Knee pain, bilateral 05/18/2016  .  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 Class 2 obesity due to excess calories without serious comorbidity in adult 06/02/2016  . Essential hypertension 08/20/2016  . Varicocele 08/20/2016  . Left testicular pain 08/20/2016  . Precordial chest pain 04/27/2017  . Mild intermittent asthma with exacerbation 04/27/2017  . Seasonal allergies 04/27/2017  . White coat syndrome with diagnosis of hypertension 05/18/2017  . Non-restorative sleep 12/20/2017  . Sleep talking 12/20/2017  . Snoring 12/20/2017  . No energy 12/20/2017  . Panic attack 03/31/2020  . Chronic left-sided low back pain with left-sided sciatica 03/31/2020  . Muscle spasm of back 04/01/2020  . Elevated blood pressure reading 04/04/2020   Resolved Ambulatory Problems    Diagnosis Date Noted  . No Resolved Ambulatory Problems   No Additional Past Medical History   Reviewed med, allergy, problem list.   Observations/Objective: No acute distress Normal breathing and no wheezing.  Normal appearance and mood.  Productive cough.  .. Today's Vitals   06/17/20 0850  BP: (!) 150/90  Temp: (!) 97 F (36.1 C)  TempSrc: Oral  Weight: 241 lb (109.3 kg)  Height: 6\' 2"  (1.88 m)   Body mass index is 30.94 kg/m.    Assessment and Plan: Marland KitchenDemond was seen today for nasal congestion.  Diagnoses and all orders for this visit:  Essential hypertension  Mild intermittent asthma with exacerbation  Chronic left-sided low back pain with left-sided sciatica  Sinobronchitis -     azithromycin (ZITHROMAX Z-PAK) 250 MG tablet; Take 2 tablets (  500 mg) on  Day 1,  followed by 1 tablet (250 mg) once daily on Days 2 through 5.   BP not to goal. Increase lisinopril to 20mg  and follow up in 1-2 weeks for new BP check.   Hold mobic until BP comes down. Start mobic for chronic low back pain. If not improving then can start gabapentin. Continue with PT.   Treated for sinusitis/bronchitis with zpak. Use albuterol  inhaler as needed. No significant wheezing. Pt does not tolerate prednisone.   Follow Up Instructions:    I discussed the assessment and treatment plan with the patient. The patient was provided an opportunity to ask questions and all were answered. The patient agreed with the plan and demonstrated an understanding of the instructions.   The patient was advised to call back or seek an in-person evaluation if the symptoms worsen or if the condition fails to improve as anticipated.   , PA-C

## 2020-06-17 NOTE — Progress Notes (Signed)
Orthopedic MD prescribed Meloxicam and Gabapentin for mid-low back pain, wanted to speak with you before starting   Also having congestion, started 2 weeks ago Was sick two weeks before that (sore throat/cough/congestion/headaches) these symptoms went away but congestion lingering Taking OTC dayquil/nyquil for sick symptoms but nothing currently for congestion

## 2020-06-18 DIAGNOSIS — H5111 Convergence insufficiency: Secondary | ICD-10-CM | POA: Diagnosis not present

## 2020-06-18 DIAGNOSIS — H52533 Spasm of accommodation, bilateral: Secondary | ICD-10-CM | POA: Diagnosis not present

## 2020-06-18 DIAGNOSIS — H5034 Intermittent alternating exotropia: Secondary | ICD-10-CM | POA: Diagnosis not present

## 2020-06-26 DIAGNOSIS — Z03818 Encounter for observation for suspected exposure to other biological agents ruled out: Secondary | ICD-10-CM | POA: Diagnosis not present

## 2020-06-27 DIAGNOSIS — Z20822 Contact with and (suspected) exposure to covid-19: Secondary | ICD-10-CM | POA: Diagnosis not present

## 2020-06-27 DIAGNOSIS — Z20828 Contact with and (suspected) exposure to other viral communicable diseases: Secondary | ICD-10-CM | POA: Diagnosis not present

## 2020-07-03 ENCOUNTER — Encounter: Payer: Self-pay | Admitting: Physician Assistant

## 2020-07-03 DIAGNOSIS — I10 Essential (primary) hypertension: Secondary | ICD-10-CM

## 2020-07-18 DIAGNOSIS — M5416 Radiculopathy, lumbar region: Secondary | ICD-10-CM | POA: Diagnosis not present

## 2020-07-22 MED ORDER — LISINOPRIL 20 MG PO TABS: 20.0000 mg | ORAL_TABLET | Freq: Every day | ORAL | 1 refills | Status: DC

## 2020-07-30 DIAGNOSIS — Z20822 Contact with and (suspected) exposure to covid-19: Secondary | ICD-10-CM | POA: Diagnosis not present

## 2020-07-30 DIAGNOSIS — Z20828 Contact with and (suspected) exposure to other viral communicable diseases: Secondary | ICD-10-CM | POA: Diagnosis not present

## 2020-08-01 DIAGNOSIS — Z20822 Contact with and (suspected) exposure to covid-19: Secondary | ICD-10-CM | POA: Diagnosis not present

## 2020-08-01 DIAGNOSIS — Z20828 Contact with and (suspected) exposure to other viral communicable diseases: Secondary | ICD-10-CM | POA: Diagnosis not present

## 2020-08-15 DIAGNOSIS — M5416 Radiculopathy, lumbar region: Secondary | ICD-10-CM | POA: Diagnosis not present

## 2020-08-18 ENCOUNTER — Other Ambulatory Visit: Payer: Self-pay | Admitting: Physician Assistant

## 2020-08-18 DIAGNOSIS — I10 Essential (primary) hypertension: Secondary | ICD-10-CM

## 2020-10-02 DIAGNOSIS — H5111 Convergence insufficiency: Secondary | ICD-10-CM | POA: Diagnosis not present

## 2020-10-02 DIAGNOSIS — H52533 Spasm of accommodation, bilateral: Secondary | ICD-10-CM | POA: Diagnosis not present

## 2020-10-02 DIAGNOSIS — H5034 Intermittent alternating exotropia: Secondary | ICD-10-CM | POA: Diagnosis not present

## 2020-10-22 DIAGNOSIS — Z20828 Contact with and (suspected) exposure to other viral communicable diseases: Secondary | ICD-10-CM | POA: Diagnosis not present

## 2020-10-22 DIAGNOSIS — Z20822 Contact with and (suspected) exposure to covid-19: Secondary | ICD-10-CM | POA: Diagnosis not present

## 2020-12-22 ENCOUNTER — Ambulatory Visit (INDEPENDENT_AMBULATORY_CARE_PROVIDER_SITE_OTHER): Payer: BC Managed Care – PPO | Admitting: Physician Assistant

## 2020-12-22 ENCOUNTER — Ambulatory Visit (INDEPENDENT_AMBULATORY_CARE_PROVIDER_SITE_OTHER): Payer: BC Managed Care – PPO

## 2020-12-22 ENCOUNTER — Encounter: Payer: Self-pay | Admitting: Physician Assistant

## 2020-12-22 ENCOUNTER — Other Ambulatory Visit: Payer: Self-pay

## 2020-12-22 VITALS — BP 138/88 | HR 63 | Ht 74.0 in | Wt 238.0 lb

## 2020-12-22 DIAGNOSIS — J4521 Mild intermittent asthma with (acute) exacerbation: Secondary | ICD-10-CM | POA: Diagnosis not present

## 2020-12-22 DIAGNOSIS — J45909 Unspecified asthma, uncomplicated: Secondary | ICD-10-CM | POA: Diagnosis not present

## 2020-12-22 DIAGNOSIS — H65191 Other acute nonsuppurative otitis media, right ear: Secondary | ICD-10-CM

## 2020-12-22 DIAGNOSIS — R058 Other specified cough: Secondary | ICD-10-CM

## 2020-12-22 DIAGNOSIS — R059 Cough, unspecified: Secondary | ICD-10-CM | POA: Diagnosis not present

## 2020-12-22 MED ORDER — FLUTICASONE PROPIONATE 50 MCG/ACT NA SUSP: 2.0000 | Freq: Every day | NASAL | 1 refills | Status: DC

## 2020-12-22 MED ORDER — METHYLPREDNISOLONE SODIUM SUCC 125 MG IJ SOLR
125.0000 mg | Freq: Once | INTRAMUSCULAR | Status: AC
Start: 2020-12-22 — End: 2020-12-22
  Administered 2020-12-22: 15:00:00 125 mg via INTRAMUSCULAR

## 2020-12-22 MED ORDER — METHYLPREDNISOLONE ACETATE 80 MG/ML IJ SUSP
80.0000 mg | Freq: Once | INTRAMUSCULAR | Status: AC
  Administered 2020-12-22: 15:00:00 80 mg via INTRAMUSCULAR

## 2020-12-22 NOTE — Progress Notes (Signed)
Subjective:    Patient ID: Juan Bryan, male    DOB: May 25, 1991, 30 y.o.   MRN: [id number]  HPI Patient is a 30 year old male with hypertension and well controlled asthma who presents to the clinic with a persistent cough for the last 3 weeks.  3 weeks ago he got a bad upper respiratory infection.  He tested negative for COVID PCR and rapid.  He was coughing, congested, headache, weakness, fever, chills, body aches for a few days.  It then resolved and he is just left with coughing. Cough is productive with green sputum. He does feel SOB. He reports wheezing. He has albuterol inhaler but does not always feel benefit.  He is using delsym and tylenol cold sinus severe.    .. 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   Class 2 obesity due to excess calories without serious comorbidity in adult 06/02/2016   Essential hypertension 08/20/2016   Varicocele 08/20/2016   Left testicular pain 08/20/2016   Precordial chest pain 04/27/2017   Mild intermittent asthma with exacerbation 04/27/2017   Seasonal allergies 04/27/2017   White coat syndrome with diagnosis of hypertension 05/18/2017   Non-restorative sleep 12/20/2017   Sleep talking 12/20/2017   Snoring 12/20/2017   No energy 12/20/2017   Panic attack 03/31/2020   Chronic left-sided low back pain with left-sided sciatica 03/31/2020   Muscle spasm of back 04/01/2020   Elevated blood pressure reading 04/04/2020   Resolved Ambulatory Problems    Diagnosis Date Noted   No Resolved Ambulatory Problems   No Additional Past Medical History      Review of Systems See HPI.     Objective:   Physical Exam Vitals reviewed.  Constitutional:      Appearance: Normal appearance.  HENT:     Head: Normocephalic.     Right Ear: Ear canal and external ear normal. There is no impacted  cerumen.     Left Ear: Tympanic membrane, ear canal and external ear normal. There is no impacted cerumen.     Ears:     Comments: Little middle ear effusion right TM.     Nose: Nose normal.     Mouth/Throat:     Mouth: Mucous membranes are moist.  Eyes:     General:        Right eye: No discharge.        Left eye: No discharge.     Extraocular Movements: Extraocular movements intact.     Conjunctiva/sclera: Conjunctivae normal.     Pupils: Pupils are equal, round, and reactive to light.  Cardiovascular:     Rate and Rhythm: Normal rate and regular rhythm.     Pulses: Normal pulses.     Heart sounds: Normal heart sounds.  Pulmonary:     Effort: Pulmonary effort is normal.     Breath sounds: Normal breath sounds. No wheezing or rhonchi.  Musculoskeletal:     Cervical back: Normal range of motion.  Lymphadenopathy:     Cervical: No cervical adenopathy.  Skin:    Findings: No rash.  Neurological:     General: No focal deficit present.     Mental Status: He is alert and oriented to person, place, and time.  Psychiatric:        Mood and Affect: Mood normal.  Assessment & Plan:  Marland KitchenMarland KitchenKristin was seen today for cough.  Diagnoses and all orders for this visit:  Post-viral cough syndrome -     fluticasone (FLONASE) 50 MCG/ACT nasal spray; Place 2 sprays into both nostrils daily. -     DG Chest 2 View; Future -     methylPREDNISolone sodium succinate (SOLU-MEDROL) 125 mg/2 mL injection 125 mg -     methylPREDNISolone acetate (DEPO-MEDROL) injection 80 mg  Mild intermittent asthma with exacerbation -     fluticasone (FLONASE) 50 MCG/ACT nasal spray; Place 2 sprays into both nostrils daily.  Cough certainly seems more post viral cough syndrome but I cannot rule out ace cough.  He did start his ACE inhibitor in January.  He did not have any symptoms until about 3 weeks ago. Pulse ox 100 percent which is reassuring.  I do not see any signs of bacterial infection today.  He  has had cough for 3 weeks.  We will get a chest x-ray.  Patient has had some reactions to oral prednisone in the past.  We will start with Solu-Medrol 125 and Depo-Medrol 80.  I would also like for him to start Flonase for the middle ear effusion. Follow-up if cough not improving or worsening in the next 48 hours we could consider abx. Continue albuterol as needed. We may consider wash out of lisinopril if cough continues.

## 2020-12-22 NOTE — Patient Instructions (Signed)
Solumedrol and depo medrol given today.  Flonase for next 7 days.  CXR downstairs.  If not better mychart me.

## 2020-12-23 ENCOUNTER — Encounter: Payer: Self-pay | Admitting: Physician Assistant

## 2020-12-24 NOTE — Progress Notes (Signed)
Both lungs are clear. Great news.

## 2020-12-26 MED ORDER — LOSARTAN POTASSIUM 25 MG PO TABS: 25.0000 mg | ORAL_TABLET | Freq: Every day | ORAL | 0 refills | Status: DC

## 2020-12-31 DIAGNOSIS — H5111 Convergence insufficiency: Secondary | ICD-10-CM | POA: Diagnosis not present

## 2020-12-31 DIAGNOSIS — H52533 Spasm of accommodation, bilateral: Secondary | ICD-10-CM | POA: Diagnosis not present

## 2020-12-31 DIAGNOSIS — H5034 Intermittent alternating exotropia: Secondary | ICD-10-CM | POA: Diagnosis not present

## 2021-01-04 DIAGNOSIS — Z20828 Contact with and (suspected) exposure to other viral communicable diseases: Secondary | ICD-10-CM | POA: Diagnosis not present

## 2021-01-04 DIAGNOSIS — Z20822 Contact with and (suspected) exposure to covid-19: Secondary | ICD-10-CM | POA: Diagnosis not present

## 2021-01-09 ENCOUNTER — Other Ambulatory Visit: Payer: Self-pay | Admitting: Physician Assistant

## 2021-01-12 ENCOUNTER — Encounter: Payer: Self-pay | Admitting: Physician Assistant

## 2021-01-13 ENCOUNTER — Other Ambulatory Visit: Payer: Self-pay | Admitting: Physician Assistant

## 2021-01-14 MED ORDER — LOSARTAN POTASSIUM 25 MG PO TABS: 25.0000 mg | ORAL_TABLET | Freq: Every day | ORAL | 0 refills | Status: DC

## 2021-01-14 MED ORDER — BUDESONIDE-FORMOTEROL FUMARATE 80-4.5 MCG/ACT IN AERO: 2.0000 | INHALATION_SPRAY | Freq: Two times a day (BID) | RESPIRATORY_TRACT | 2 refills | Status: DC

## 2021-02-01 DIAGNOSIS — Z20828 Contact with and (suspected) exposure to other viral communicable diseases: Secondary | ICD-10-CM | POA: Diagnosis not present

## 2021-02-19 ENCOUNTER — Other Ambulatory Visit: Payer: Self-pay | Admitting: Physician Assistant

## 2021-03-04 DIAGNOSIS — H5034 Intermittent alternating exotropia: Secondary | ICD-10-CM | POA: Diagnosis not present

## 2021-03-04 DIAGNOSIS — H5111 Convergence insufficiency: Secondary | ICD-10-CM | POA: Diagnosis not present

## 2021-03-04 DIAGNOSIS — H52533 Spasm of accommodation, bilateral: Secondary | ICD-10-CM | POA: Diagnosis not present

## 2021-03-10 ENCOUNTER — Other Ambulatory Visit: Payer: Self-pay | Admitting: Physician Assistant

## 2021-03-11 MED ORDER — LOSARTAN POTASSIUM 25 MG PO TABS: 25.0000 mg | ORAL_TABLET | Freq: Every day | ORAL | 0 refills | Status: DC

## 2021-03-24 ENCOUNTER — Ambulatory Visit (INDEPENDENT_AMBULATORY_CARE_PROVIDER_SITE_OTHER): Payer: BC Managed Care – PPO | Admitting: Physician Assistant

## 2021-03-24 ENCOUNTER — Other Ambulatory Visit: Payer: Self-pay

## 2021-03-24 ENCOUNTER — Encounter: Payer: Self-pay | Admitting: Physician Assistant

## 2021-03-24 VITALS — BP 128/88 | HR 62 | Ht 74.0 in | Wt 252.0 lb

## 2021-03-24 DIAGNOSIS — Z1329 Encounter for screening for other suspected endocrine disorder: Secondary | ICD-10-CM

## 2021-03-24 DIAGNOSIS — Z79899 Other long term (current) drug therapy: Secondary | ICD-10-CM | POA: Diagnosis not present

## 2021-03-24 DIAGNOSIS — I10 Essential (primary) hypertension: Secondary | ICD-10-CM

## 2021-03-24 DIAGNOSIS — S29012A Strain of muscle and tendon of back wall of thorax, initial encounter: Secondary | ICD-10-CM | POA: Diagnosis not present

## 2021-03-24 DIAGNOSIS — G43109 Migraine with aura, not intractable, without status migrainosus: Secondary | ICD-10-CM | POA: Diagnosis not present

## 2021-03-24 DIAGNOSIS — Z1159 Encounter for screening for other viral diseases: Secondary | ICD-10-CM

## 2021-03-24 DIAGNOSIS — Z23 Encounter for immunization: Secondary | ICD-10-CM | POA: Diagnosis not present

## 2021-03-24 DIAGNOSIS — R030 Elevated blood-pressure reading, without diagnosis of hypertension: Secondary | ICD-10-CM

## 2021-03-24 DIAGNOSIS — Z13220 Encounter for screening for lipoid disorders: Secondary | ICD-10-CM

## 2021-03-24 DIAGNOSIS — Z131 Encounter for screening for diabetes mellitus: Secondary | ICD-10-CM

## 2021-03-24 MED ORDER — RIZATRIPTAN BENZOATE 10 MG PO TABS
10.0000 mg | ORAL_TABLET | ORAL | 0 refills | Status: DC | PRN
Start: 2021-03-24 — End: 2022-01-08

## 2021-03-24 MED ORDER — LOSARTAN POTASSIUM 25 MG PO TABS: 25.0000 mg | ORAL_TABLET | Freq: Every day | ORAL | 1 refills | Status: DC

## 2021-03-24 MED ORDER — CYCLOBENZAPRINE HCL 10 MG PO TABS: 10.0000 mg | ORAL_TABLET | Freq: Three times a day (TID) | ORAL | 0 refills | Status: DC | PRN

## 2021-03-24 NOTE — Progress Notes (Signed)
Subjective:    Patient ID: Juan Bryan, male    DOB: Jan 17, 1991, 30 y.o.   MRN: [id number]  HPI Patient is a 30 year old male with hypertension who presents to the clinic for 37-month follow-up and medication refill.  Patient is doing well on losartan.  He is not having any ongoing cough.  He checks his blood pressure a few times at home and has always been in the 120s over 80s.  He denies any chest pain, palpitations.  Patient does have a history of migraines.  He is having 1-2 a month.  He is using Tylenol for control.  He does have some ocular symptoms with his migraines.  Patient has been working out at [employer] and [employer].  Yesterday he feels like he pulled a muscle in his right mid back.  He denies any numbness or tingling or radiation of pain.  He has not done anything to make better. Pain to lift and move right arm. No shoulder pain.  .. 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   Class 2 obesity due to excess calories without serious comorbidity in adult 06/02/2016   Essential hypertension 08/20/2016   Varicocele 08/20/2016   Left testicular pain 08/20/2016   Precordial chest pain 04/27/2017   Mild intermittent asthma with exacerbation 04/27/2017   Seasonal allergies 04/27/2017   White coat syndrome with diagnosis of hypertension 05/18/2017   Non-restorative sleep 12/20/2017   Sleep talking 12/20/2017   Snoring 12/20/2017   No energy 12/20/2017   Panic attack 03/31/2020   Chronic left-sided low back pain with left-sided sciatica 03/31/2020   Muscle spasm of back 04/01/2020   Elevated blood pressure reading 04/04/2020   Acute middle ear effusion, right 12/22/2020   Post-viral cough syndrome 12/22/2020   Strain of rhomboid muscle 03/24/2021   Migraine with aura and without status migrainosus, not intractable  03/24/2021   Resolved Ambulatory Problems    Diagnosis Date Noted   No Resolved Ambulatory Problems   No Additional Past Medical History     Review of Systems  All other systems reviewed and are negative.     Objective:   Physical Exam Vitals reviewed.  Constitutional:      Appearance: Normal appearance.  HENT:     Head: Normocephalic.  Cardiovascular:     Rate and Rhythm: Normal rate and regular rhythm.     Pulses: Normal pulses.     Heart sounds: Normal heart sounds.  Pulmonary:     Effort: Pulmonary effort is normal.     Breath sounds: Normal breath sounds.  Musculoskeletal:     Cervical back: Normal range of motion and neck supple.     Comments: Pain to palpation and tightness over right rhomboid muscle.   Neurological:     General: No focal deficit present.     Mental Status: He is alert and oriented to person, place, and time.  Psychiatric:        Mood and Affect: Mood normal.        Behavior: Behavior normal.          Assessment & Plan:  Marland KitchenMarland KitchenJedd was seen today for hypertension.  Diagnoses and all orders for this visit:  Primary hypertension -     COMPLETE METABOLIC PANEL WITH GFR -     losartan (COZAAR) 25 MG tablet; Take  1 tablet (25 mg total) by mouth daily.  Medication management -     TSH -     Lipid Panel w/reflex Direct LDL -     COMPLETE METABOLIC PANEL WITH GFR -     CBC with Differential/Platelet  Lipid screening -     Lipid Panel w/reflex Direct LDL  Diabetes mellitus screening -     COMPLETE METABOLIC PANEL WITH GFR  Thyroid disorder screen -     TSH  Flu vaccine need -     Flu Vaccine QUAD 24mo+IM (Fluarix, Fluzone & Alfiuria Quad PF)  Encounter for hepatitis C screening test for low risk patient -     Hepatitis C Antibody  Strain of rhomboid muscle, initial encounter -     cyclobenzaprine (FLEXERIL) 10 MG tablet; Take 1 tablet (10 mg total) by mouth 3 (three) times daily as needed for muscle spasms.  Migraine with aura  and without status migrainosus, not intractable -     rizatriptan (MAXALT) 10 MG tablet; Take 1 tablet (10 mg total) by mouth as needed for migraine. May repeat in 2 hours if needed   BP looks great on 2nd recheck. Refilled losaartan.  Cmp ordered today with other fasting labs.   Maxalt for migraine rescue. If having more than 4 migraine days a month consider a preventative.   Flexeril and NSAIds for rhomboid muscle strain. Discussed icy hot patches and stretches.  Follow up as needed or if symptoms worsen.  Take a 3-5 day break from working out with weights.   Follow up in 6 months.

## 2021-03-24 NOTE — Patient Instructions (Addendum)
Magnesium oxide 400-600mg  daily at bedtime.  Zinc 50mg  and vitamin C 500mg  for immune support Ibuprofen up to 800mg  every 3 times a day.

## 2021-09-27 ENCOUNTER — Other Ambulatory Visit: Payer: Self-pay | Admitting: Physician Assistant

## 2021-09-27 DIAGNOSIS — I10 Essential (primary) hypertension: Secondary | ICD-10-CM

## 2021-10-05 ENCOUNTER — Encounter: Payer: Self-pay | Admitting: Physician Assistant

## 2021-10-05 DIAGNOSIS — J4521 Mild intermittent asthma with (acute) exacerbation: Secondary | ICD-10-CM

## 2021-10-05 MED ORDER — ALBUTEROL SULFATE HFA 108 (90 BASE) MCG/ACT IN AERS: 2.0000 | INHALATION_SPRAY | Freq: Four times a day (QID) | RESPIRATORY_TRACT | 1 refills | Status: AC | PRN

## 2021-12-27 ENCOUNTER — Other Ambulatory Visit: Payer: Self-pay | Admitting: Physician Assistant

## 2021-12-27 DIAGNOSIS — I10 Essential (primary) hypertension: Secondary | ICD-10-CM

## 2021-12-28 ENCOUNTER — Encounter: Payer: Self-pay | Admitting: Physician Assistant

## 2022-01-08 ENCOUNTER — Encounter: Payer: Self-pay | Admitting: Physician Assistant

## 2022-01-08 ENCOUNTER — Ambulatory Visit (INDEPENDENT_AMBULATORY_CARE_PROVIDER_SITE_OTHER): Payer: BC Managed Care – PPO | Admitting: Physician Assistant

## 2022-01-08 VITALS — BP 125/72 | HR 73 | Ht 74.0 in | Wt 250.0 lb

## 2022-01-08 DIAGNOSIS — Z0000 Encounter for general adult medical examination without abnormal findings: Secondary | ICD-10-CM | POA: Diagnosis not present

## 2022-01-08 DIAGNOSIS — H52533 Spasm of accommodation, bilateral: Secondary | ICD-10-CM

## 2022-01-08 DIAGNOSIS — I10 Essential (primary) hypertension: Secondary | ICD-10-CM | POA: Diagnosis not present

## 2022-01-08 DIAGNOSIS — Z131 Encounter for screening for diabetes mellitus: Secondary | ICD-10-CM

## 2022-01-08 DIAGNOSIS — H5111 Convergence insufficiency: Secondary | ICD-10-CM

## 2022-01-08 DIAGNOSIS — H5034 Intermittent alternating exotropia: Secondary | ICD-10-CM

## 2022-01-08 DIAGNOSIS — Z1159 Encounter for screening for other viral diseases: Secondary | ICD-10-CM

## 2022-01-08 DIAGNOSIS — Z13220 Encounter for screening for lipoid disorders: Secondary | ICD-10-CM | POA: Diagnosis not present

## 2022-01-08 DIAGNOSIS — R351 Nocturia: Secondary | ICD-10-CM

## 2022-01-08 DIAGNOSIS — Z79899 Other long term (current) drug therapy: Secondary | ICD-10-CM

## 2022-01-08 DIAGNOSIS — Z1329 Encounter for screening for other suspected endocrine disorder: Secondary | ICD-10-CM

## 2022-01-08 MED ORDER — LOSARTAN POTASSIUM 25 MG PO TABS: 25.0000 mg | ORAL_TABLET | Freq: Every day | ORAL | 1 refills | Status: DC

## 2022-01-08 NOTE — Patient Instructions (Signed)
Health Maintenance, Male Adopting a healthy lifestyle and getting preventive care are important in promoting health and wellness. Ask your health care provider about: The right schedule for you to have regular tests and exams. Things you can do on your own to prevent diseases and keep yourself healthy. What should I know about diet, weight, and exercise? Eat a healthy diet  Eat a diet that includes plenty of vegetables, fruits, low-fat dairy products, and lean protein. Do not eat a lot of foods that are high in solid fats, added sugars, or sodium. Maintain a healthy weight Body mass index (BMI) is a measurement that can be used to identify possible weight problems. It estimates body fat based on height and weight. Your health care provider can help determine your BMI and help you achieve or maintain a healthy weight. Get regular exercise Get regular exercise. This is one of the most important things you can do for your health. Most adults should: Exercise for at least 150 minutes each week. The exercise should increase your heart rate and make you sweat (moderate-intensity exercise). Do strengthening exercises at least twice a week. This is in addition to the moderate-intensity exercise. Spend less time sitting. Even light physical activity can be beneficial. Watch cholesterol and blood lipids Have your blood tested for lipids and cholesterol at 31 years of age, then have this test every 5 years. You may need to have your cholesterol levels checked more often if: Your lipid or cholesterol levels are high. You are older than 31 years of age. You are at high risk for heart disease. What should I know about cancer screening? Many types of cancers can be detected early and may often be prevented. Depending on your health history and family history, you may need to have cancer screening at various ages. This may include screening for: Colorectal cancer. Prostate cancer. Skin cancer. Lung  cancer. What should I know about heart disease, diabetes, and high blood pressure? Blood pressure and heart disease High blood pressure causes heart disease and increases the risk of stroke. This is more likely to develop in people who have high blood pressure readings or are overweight. Talk with your health care provider about your target blood pressure readings. Have your blood pressure checked: Every 3-5 years if you are 18-39 years of age. Every year if you are 40 years old or older. If you are between the ages of 65 and 75 and are a current or former smoker, ask your health care provider if you should have a one-time screening for abdominal aortic aneurysm (AAA). Diabetes Have regular diabetes screenings. This checks your fasting blood sugar level. Have the screening done: Once every three years after age 45 if you are at a normal weight and have a low risk for diabetes. More often and at a younger age if you are overweight or have a high risk for diabetes. What should I know about preventing infection? Hepatitis B If you have a higher risk for hepatitis B, you should be screened for this virus. Talk with your health care provider to find out if you are at risk for hepatitis B infection. Hepatitis C Blood testing is recommended for: Everyone born from 1945 through 1965. Anyone with known risk factors for hepatitis C. Sexually transmitted infections (STIs) You should be screened each year for STIs, including gonorrhea and chlamydia, if: You are sexually active and are younger than 31 years of age. You are older than 31 years of age and your   health care provider tells you that you are at risk for this type of infection. Your sexual activity has changed since you were last screened, and you are at increased risk for chlamydia or gonorrhea. Ask your health care provider if you are at risk. Ask your health care provider about whether you are at high risk for HIV. Your health care provider  may recommend a prescription medicine to help prevent HIV infection. If you choose to take medicine to prevent HIV, you should first get tested for HIV. You should then be tested every 3 months for as long as you are taking the medicine. Follow these instructions at home: Alcohol use Do not drink alcohol if your health care provider tells you not to drink. If you drink alcohol: Limit how much you have to 0-2 drinks a day. Know how much alcohol is in your drink. In the [place] one drink equals one 12 oz bottle of beer (355 mL), one 5 oz glass of wine (148 mL), or one 1 oz glass of hard liquor (44 mL). Lifestyle Do not use any products that contain nicotine or tobacco. These products include cigarettes, chewing tobacco, and vaping devices, such as e-cigarettes. If you need help quitting, ask your health care provider. Do not use street drugs. Do not share needles. Ask your health care provider for help if you need support or information about quitting drugs. General instructions Schedule regular health, dental, and eye exams. Stay current with your vaccines. Tell your health care provider if: You often feel depressed. You have ever been abused or do not feel safe at home. Summary Adopting a healthy lifestyle and getting preventive care are important in promoting health and wellness. Follow your health care provider's instructions about healthy diet, exercising, and getting tested or screened for diseases. Follow your health care provider's instructions on monitoring your cholesterol and blood pressure. This information is not intended to replace advice given to you by your health care provider. Make sure you discuss any questions you have with your health care provider. Document Revised: 11/10/2020 Document Reviewed: 11/10/2020 Elsevier Patient Education  2023 Elsevier Inc.  

## 2022-01-11 DIAGNOSIS — H52533 Spasm of accommodation, bilateral: Secondary | ICD-10-CM | POA: Insufficient documentation

## 2022-01-11 DIAGNOSIS — H5111 Convergence insufficiency: Secondary | ICD-10-CM | POA: Insufficient documentation

## 2022-01-11 DIAGNOSIS — H5034 Intermittent alternating exotropia: Secondary | ICD-10-CM | POA: Insufficient documentation

## 2022-01-11 NOTE — Telephone Encounter (Signed)
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

## 2022-01-13 DIAGNOSIS — R351 Nocturia: Secondary | ICD-10-CM | POA: Insufficient documentation

## 2022-01-13 DIAGNOSIS — Z0000 Encounter for general adult medical examination without abnormal findings: Secondary | ICD-10-CM | POA: Insufficient documentation

## 2022-01-13 NOTE — Progress Notes (Signed)
Complete physical exam  Patient: Juan Bryan   DOB: 08-02-1990   31 y.o. Male  MRN: [id number]  Subjective:    Chief Complaint  Patient presents with   Follow-up    Juan Bryan is a 31 y.o. male who presents today for a complete physical exam. He reports consuming a general diet. The patient does not participate in regular exercise at present. He generally feels well. He reports sleeping well. He does have additional problems to discuss today.   He is frustrated with his eye spasms and convergence insufficiency. He feels like he needs to have eye rest often. He has seen opthalmology. He did 32 sessions of PT with no real benefit. He wonders what his next steps will be.   He does mention having to get up 2 times a night to urinate. No daytime issues. No pain with urination.    Most recent fall risk assessment:    01/08/2022    4:11 PM  Fall Risk   Falls in the past year? 0  Number falls in past yr: 0  Injury with Fall? 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   Spasm of eye muscle, bilateral 01/11/2022   Strain of rhomboid muscle 03/24/2021   Migraine with aura and without status migrainosus, not intractable 03/24/2021   Acute middle ear effusion, right 12/22/2020   Post-viral cough syndrome 12/22/2020   Elevated blood pressure reading 04/04/2020   Muscle spasm of back 04/01/2020   Panic attack 03/31/2020   Chronic left-sided low back pain with left-sided sciatica 03/31/2020   Non-restorative sleep 12/20/2017   Sleep  talking 12/20/2017   Snoring 12/20/2017   No energy 12/20/2017   White coat syndrome with diagnosis of hypertension 05/18/2017   Precordial chest pain 04/27/2017   Mild intermittent asthma with exacerbation 04/27/2017   Seasonal allergies 04/27/2017   Essential hypertension 08/20/2016   Varicocele 08/20/2016   Left testicular pain 08/20/2016   Class 2 obesity due to excess calories without serious comorbidity in adult 06/02/2016   Finger pain, right 05/18/2016   Knee pain, bilateral 05/18/2016   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   albuterol (VENTOLIN HFA) 108 (90 Base) MCG/ACT inhaler Inhale 2 puffs into the lungs every 6 (six) hours as  needed for wheezing or shortness of breath.   loratadine (CLARITIN) 10 MG tablet Take 10 mg by mouth daily.   [DISCONTINUED] budesonide-formoterol (SYMBICORT) 80-4.5 MCG/ACT inhaler Inhale 2 puffs into the lungs 2 (two) times daily.   [DISCONTINUED] cyclobenzaprine (FLEXERIL) 10 MG tablet Take 1 tablet (10 mg total) by mouth 3 (three) times daily as needed for muscle spasms.   [DISCONTINUED] fluticasone (FLONASE) 50 MCG/ACT nasal spray Place 2 sprays into both nostrils daily.   [DISCONTINUED] losartan (COZAAR) 25 MG tablet Take 1 tablet (25 mg total) by mouth daily. NEEDS APPT   [DISCONTINUED] rizatriptan (MAXALT) 10 MG tablet Take 1 tablet (10 mg total) by mouth as needed  for migraine. May repeat in 2 hours if needed   No facility-administered medications prior to visit.    ROS  See HPI.       Objective:     BP 125/72   Pulse 73   Ht 6\' 2"  (1.88 m)   Wt 250 lb (113.4 kg)   SpO2 97%   BMI 32.10 kg/m  BP Readings from Last 3 Encounters:  01/08/22 125/72  03/24/21 128/88  12/22/20 138/88   ..    01/08/2022    4:11 PM 03/31/2020    4:37 PM 06/11/2019    8:37 AM 12/20/2017   10:26 AM  Depression screen PHQ 2/9  Decreased Interest 1 0 1 1  Down, Depressed, Hopeless 1 3 1 2   PHQ - 2 Score 2 3 2 3   Altered sleeping 2 3 0 0  Tired, decreased energy 3 3 3 3   Change in appetite 1 3 0 2  Feeling bad or failure about yourself  0 2 0 2  Trouble concentrating 2 3 1 2   Moving slowly or fidgety/restless 0 1 0 0  Suicidal thoughts 0 0 0 0  PHQ-9 Score 10 18 6 12   Difficult doing work/chores Somewhat difficult Somewhat difficult Not difficult at all Not difficult at all   ..    01/08/2022    4:12 PM 03/31/2020    4:38 PM 06/11/2019    8:39 AM  GAD 7 : Generalized Anxiety Score  Nervous, Anxious, on Edge 1 3 2   Control/stop worrying 1 3 2   Worry too much - different things 2 3 2   Trouble relaxing 0 2 0  Restless 1 1 0  Easily annoyed or irritable 3 1 1   Afraid - awful might happen 3 3 1   Total GAD 7 Score 11 16 8   Anxiety Difficulty Not difficult at all Somewhat difficult Not difficult at all      Physical Exam  BP 125/72   Pulse 73   Ht 6\' 2"  (1.88 m)   Wt 250 lb (113.4 kg)   SpO2 97%   BMI 32.10 kg/m   General Appearance:    Alert, cooperative, no distress, appears stated age  Head:    Normocephalic, without obvious abnormality, atraumatic  Eyes:    PERRL, conjunctiva/corneas clear, EOM's intact, fundi    benign, both eyes       Ears:    Normal TM's and external ear canals, both ears  Nose:   Nares normal, septum midline, mucosa normal, no drainage    or sinus tenderness  Throat:   Lips, mucosa, and tongue normal; teeth and gums  normal  Neck:   Supple, symmetrical, trachea midline, no adenopathy;       thyroid:  No enlargement/tenderness/nodules; no carotid   bruit or JVD  Back:  Symmetric, no curvature, ROM normal, no CVA tenderness  Lungs:     Clear to auscultation bilaterally, respirations unlabored  Chest wall:    No tenderness or deformity  Heart:    Regular rate and rhythm, S1 and S2 normal, no murmur, rub   or gallop  Abdomen:     Soft, non-tender, bowel sounds active all four quadrants,    no masses, no organomegaly        Extremities:   Extremities normal, atraumatic, no cyanosis or edema  Pulses:   2+ and symmetric all extremities  Skin:   Skin color, texture, turgor normal, no rashes or lesions  Lymph nodes:   Cervical, supraclavicular, and axillary nodes normal  Neurologic:   CNII-XII intact. Normal strength, sensation and reflexes      throughout      Assessment & Plan:    Routine Health Maintenance and Physical Exam  Immunization History  Administered Date(s) Administered   Influenza,inj,Quad PF,6+ Mos 04/07/2018, 03/24/2021   PFIZER(Purple Top)SARS-COV-2 Vaccination 09/08/2019, 10/01/2019    Health Maintenance  Topic Date Due   Hepatitis C Screening  Never done   TETANUS/TDAP  Never done   COVID-19 Vaccine (3 - Pfizer risk series) 10/29/2019   HIV Screening  01/09/2023 (Originally 10/09/2005)   INFLUENZA VACCINE  02/02/2022   HPV VACCINES  Aged Out    Discussed health benefits of physical activity, and encouraged him to engage in regular exercise appropriate for his age and condition.  Marland KitchenReuel Boom was seen today for follow-up.  Diagnoses and all orders for this visit:  Routine physical examination  Primary hypertension -     COMPLETE METABOLIC PANEL WITH GFR -     losartan (COZAAR) 25 MG tablet; Take 1 tablet (25 mg total) by mouth daily.  Medication management -     TSH -     Lipid Panel w/reflex Direct LDL -     COMPLETE METABOLIC PANEL WITH GFR -     CBC with  Differential/Platelet  Lipid screening -     Lipid Panel w/reflex Direct LDL  Diabetes mellitus screening  Thyroid disorder screen -     TSH  Encounter for hepatitis C screening test for low risk patient -     Hepatitis C Antibody  Nocturia -     PSA, total and free   Fasting labs ordered. BP looks great today. He is not taking medication. Ok to stop and monitor BP and see if stays looking this good.  PHQ elevated likely due to his eye issues. Discussed maybe 2nd opinion?  PSA ordered to follow up on nocturia.  Return in about 6 months (around 07/11/2022), or if symptoms worsen or fail to improve.     Tandy Gaw, PA-C

## 2022-01-14 DIAGNOSIS — Z1159 Encounter for screening for other viral diseases: Secondary | ICD-10-CM | POA: Diagnosis not present

## 2022-01-14 DIAGNOSIS — Z13220 Encounter for screening for lipoid disorders: Secondary | ICD-10-CM | POA: Diagnosis not present

## 2022-01-14 DIAGNOSIS — Z79899 Other long term (current) drug therapy: Secondary | ICD-10-CM | POA: Diagnosis not present

## 2022-01-14 DIAGNOSIS — I10 Essential (primary) hypertension: Secondary | ICD-10-CM | POA: Diagnosis not present

## 2022-01-15 NOTE — Progress Notes (Signed)
Dan,   Thyroid looks great.  Cholesterol looks wonderful.  Kidney, liver, glucose look good.   Labs look great.

## 2022-01-18 LAB — CBC WITH DIFFERENTIAL/PLATELET
Absolute Monocytes: 279 cells/uL (ref 200–950)
Basophils Absolute: 41 cells/uL (ref 0–200)
Basophils Relative: 1 %
Eosinophils Absolute: 119 cells/uL (ref 15–500)
Eosinophils Relative: 2.9 %
HCT: 47.6 % (ref 38.5–50.0)
Hemoglobin: 16.3 g/dL (ref 13.2–17.1)
Lymphs Abs: 1267 cells/uL (ref 850–3900)
MCH: 30.1 pg (ref 27.0–33.0)
MCHC: 34.2 g/dL (ref 32.0–36.0)
MCV: 87.8 fL (ref 80.0–100.0)
MPV: 9.2 fL (ref 7.5–12.5)
Monocytes Relative: 6.8 %
Neutro Abs: 2394 cells/uL (ref 1500–7800)
Neutrophils Relative %: 58.4 %
Platelets: 206 10*3/uL (ref 140–400)
RBC: 5.42 10*6/uL (ref 4.20–5.80)
RDW: 12.3 % (ref 11.0–15.0)
Total Lymphocyte: 30.9 %
WBC: 4.1 10*3/uL (ref 3.8–10.8)

## 2022-01-18 LAB — COMPLETE METABOLIC PANEL WITH GFR
AG Ratio: 2.3 (calc) (ref 1.0–2.5)
ALT: 15 U/L (ref 9–46)
AST: 16 U/L (ref 10–40)
Albumin: 4.8 g/dL (ref 3.6–5.1)
Alkaline phosphatase (APISO): 63 U/L (ref 36–130)
BUN: 16 mg/dL (ref 7–25)
CO2: 29 mmol/L (ref 20–32)
Calcium: 9.5 mg/dL (ref 8.6–10.3)
Chloride: 105 mmol/L (ref 98–110)
Creat: 0.97 mg/dL (ref 0.60–1.26)
Globulin: 2.1 g/dL (calc) (ref 1.9–3.7)
Glucose, Bld: 83 mg/dL (ref 65–99)
Potassium: 4.5 mmol/L (ref 3.5–5.3)
Sodium: 141 mmol/L (ref 135–146)
Total Bilirubin: 0.8 mg/dL (ref 0.2–1.2)
Total Protein: 6.9 g/dL (ref 6.1–8.1)
eGFR: 107 mL/min/{1.73_m2} (ref >=60)

## 2022-01-18 LAB — LIPID PANEL W/REFLEX DIRECT LDL
Cholesterol: 142 mg/dL (ref <200)
HDL: 53 mg/dL (ref >=40)
LDL Cholesterol (Calc): 76 mg/dL (calc)
Non-HDL Cholesterol (Calc): 89 mg/dL (calc) (ref <130)
Total CHOL/HDL Ratio: 2.7 (calc) (ref <5.0)
Triglycerides: 47 mg/dL (ref <150)

## 2022-01-18 LAB — PSA, TOTAL AND FREE
PSA, % Free: 50 % (calc) (ref >25)
PSA, Free: 0.1 ng/mL
PSA, Total: 0.2 ng/mL (ref <=4.0)

## 2022-01-18 LAB — TSH: TSH: 1.46 mIU/L (ref 0.40–4.50)

## 2022-01-18 LAB — HEPATITIS C ANTIBODY: Hepatitis C Ab: NONREACTIVE

## 2022-02-14 ENCOUNTER — Ambulatory Visit
Admission: EM | Admit: 2022-02-14 | Discharge: 2022-02-14 | Disposition: A | Discharge status: Home / Self Care | Payer: BC Managed Care – PPO | Attending: Family Medicine | Admitting: Family Medicine

## 2022-02-14 ENCOUNTER — Other Ambulatory Visit: Payer: Self-pay

## 2022-02-14 ENCOUNTER — Ambulatory Visit (INDEPENDENT_AMBULATORY_CARE_PROVIDER_SITE_OTHER): Payer: BC Managed Care – PPO

## 2022-02-14 DIAGNOSIS — R0789 Other chest pain: Secondary | ICD-10-CM | POA: Diagnosis not present

## 2022-02-14 DIAGNOSIS — R079 Chest pain, unspecified: Secondary | ICD-10-CM

## 2022-02-14 MED ORDER — METHOCARBAMOL 500 MG PO TABS: 500.0000 mg | ORAL_TABLET | Freq: Three times a day (TID) | ORAL | 0 refills | Status: DC | PRN

## 2022-02-14 MED ORDER — CELECOXIB 100 MG PO CAPS: 100.0000 mg | ORAL_CAPSULE | Freq: Two times a day (BID) | ORAL | 0 refills | Status: AC

## 2022-02-14 NOTE — ED Triage Notes (Signed)
Pt states that he has some chest pain. X2 days  Pt states that he started to has some chest pressure after working out.  Pt states that he was also drinking and noticed the chest pressure as well.

## 2022-02-14 NOTE — ED Provider Notes (Signed)
[facility]    CSN: [id number] Arrival date & time: 02/14/22  1009      History   Chief Complaint Chief Complaint  Patient presents with   Chest Pain    Chest pain x2 days    HPI Juan Bryan is a 31 y.o. male.   HPI Pleasant 31 year old male presents with chest pain for 2 days.  Reports feeling chest pressure after working out.  PMH significant for HTN, obesity, asthma, and precordial chest pain.  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   Spasm of eye muscle, bilateral 01/11/2022   Strain of rhomboid muscle 03/24/2021   Migraine with aura and without status migrainosus, not intractable 03/24/2021   Acute middle ear effusion, right 12/22/2020   Post-viral cough syndrome 12/22/2020   Elevated blood pressure reading 04/04/2020   Muscle spasm of back 04/01/2020   Panic attack 03/31/2020   Chronic left-sided low back pain with left-sided sciatica 03/31/2020   Non-restorative sleep 12/20/2017   Sleep talking 12/20/2017   Snoring 12/20/2017   No energy 12/20/2017   White coat syndrome with diagnosis of hypertension 05/18/2017   Precordial chest pain 04/27/2017   Mild intermittent asthma with exacerbation 04/27/2017   Seasonal allergies 04/27/2017   Essential hypertension 08/20/2016   Varicocele 08/20/2016   Left testicular pain 08/20/2016   Class 2 obesity due to excess calories without serious comorbidity in adult 06/02/2016   Finger pain, right 05/18/2016   Knee pain, bilateral 05/18/2016   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 No pertinent surgical history.     Home Medications    Prior to Admission medications   Medication Sig Start Date End Date Taking?  Authorizing Provider  albuterol (VENTOLIN HFA) 108 (90 Base) MCG/ACT inhaler Inhale 2 puffs into the lungs every 6 (six) hours as needed for wheezing or shortness of breath. 10/05/21 10/05/22 Yes Breeback, Jade L, PA-C  celecoxib (CELEBREX) 100 MG capsule Take 1 capsule (100 mg total) by mouth 2 (two) times daily for 15 days. 02/14/22 03/01/22 Yes Trevor Iha, FNP  loratadine (CLARITIN) 10 MG tablet Take 10 mg by mouth daily.   Yes [provider]  methocarbamol (ROBAXIN) 500 MG tablet Take 1 tablet (500 mg total) by mouth 3 (three) times daily as needed for muscle spasms. 02/14/22  Yes Trevor Iha, FNP  losartan (COZAAR) 25 MG tablet Take 1 tablet (25 mg total) by mouth daily. 01/08/22   Breeback, Jade L, PA-C  magnesium oxide (MAG-OX) 400 (240 Mg) MG tablet Take 400 mg by mouth daily.    [provider]    Family History Family History  Problem Relation Age of Onset   Diabetes Mother    Hypertension Mother    Diabetes Father    Hypertension Father     Social History Social History   Tobacco Use   Smoking status: Former   Smokeless tobacco: Never  Substance Use Topics   Alcohol use: Yes   Drug use: No     Allergies   Penicillin g and Prednisone   Review of Systems Review of Systems  Cardiovascular:        Chest pressure x2 days  All other systems reviewed and are negative.  Physical Exam Triage Vital Signs ED Triage Vitals  Enc Vitals Group     BP 02/14/22 1021 (!) 145/96     Pulse Rate 02/14/22 1021 77     Resp 02/14/22 1021 18     Temp 02/14/22 1021 98.5 F (36.9 C)     Temp Source 02/14/22 1021 Oral     SpO2 02/14/22 1021 99 %     Weight 02/14/22 1018 239 lb (108.4 kg)     Height 02/14/22 1018 6\' 2"  (1.88 m)     Head Circumference --      Peak Flow --      Pain Score 02/14/22 1018 0     Pain Loc --      Pain Edu? --      Excl. in GC? --    No data found.  Updated Vital Signs BP (!) 145/96 (BP Location: Left Arm)   Pulse 77   Temp  98.5 F (36.9 C) (Oral)   Resp 18   Ht 6\' 2"  (1.88 m)   Wt 239 lb (108.4 kg)   SpO2 99%   BMI 30.69 kg/m    Physical Exam Vitals and nursing note reviewed.  Constitutional:      General: He is not in acute distress.    Appearance: Normal appearance. He is normal weight. He is not ill-appearing.  HENT:     Head: Normocephalic and atraumatic.     Mouth/Throat:     Mouth: Mucous membranes are moist.     Pharynx: Oropharynx is clear.  Eyes:     Extraocular Movements: Extraocular movements intact.     Conjunctiva/sclera: Conjunctivae normal.     Pupils: Pupils are equal, round, and reactive to light.  Cardiovascular:     Rate and Rhythm: Normal rate and regular rhythm.     Pulses: Normal pulses.     Heart sounds: Normal heart sounds.  Pulmonary:     Effort: Pulmonary effort is normal.     Breath sounds: Normal breath sounds. No wheezing, rhonchi or rales.  Musculoskeletal:        General: Normal range of motion.     Cervical back: Normal range of motion and neck supple.  Skin:    General: Skin is warm and dry.  Neurological:     General: No focal deficit present.     Mental Status: He is alert and oriented to person, place, and time. Mental status is at baseline.      UC Treatments / Results  Labs (all labs ordered are listed, but only abnormal results are displayed) Labs Reviewed - No data to display  EKG   Radiology DG Chest 2 View  Result Date: 02/14/2022 CLINICAL DATA:  Chest pain. EXAM: CHEST - 2 VIEW COMPARISON:  None Available. FINDINGS: The heart size and mediastinal contours are within normal limits. Both lungs are clear. The visualized skeletal structures are unremarkable. IMPRESSION: No active cardiopulmonary disease. Electronically Signed   By: III M.D.   On: 02/14/2022 11:43    Procedures Procedures (including critical care time)  Medications Ordered in UC Medications - No data to display  Initial Impression / Assessment and Plan /  UC Course  I have reviewed the triage vital signs and the nursing notes.  Pertinent labs & imaging results that were available during my care of the patient were reviewed by me and considered in my medical decision making (see chart for details).     MDM: 1.  Atypical chest  pain-EKG reveals NSR. CXR revealed above; 2.  Chest wall pain-Rx'd Celebrex, Robaxin. Advised patient to take medication as directed with food to completion.  Advised may take Robaxin daily or as needed for accompanying muscle spasms/skeletal muscle pain of chest region.  Encouraged patient to increase daily water intake while taking these medications.  Encourage patient to avoid repetitive activities involving chest muscles for the next 5 to 7 days.  Advised if symptoms worsen and/or unresolved please follow-up with PCP or here for further evaluation.  Patient discharged home, hemodynamically stable. Final Clinical Impressions(s) / UC Diagnoses   Final diagnoses:  Atypical chest pain  Chest wall pain     Discharge Instructions      Advised patient to take medication as directed with food to completion.  Advised may take Robaxin daily or as needed for accompanying muscle spasms/skeletal muscle pain of chest region.  Encouraged patient to increase daily water intake while taking these medications.  Encourage patient to avoid repetitive activities involving chest muscles for the next 5 to 7 days.  Advised if symptoms worsen and/or unresolved please follow-up with PCP or here for further evaluation.     ED Prescriptions     Medication Sig Dispense Auth. Provider   celecoxib (CELEBREX) 100 MG capsule Take 1 capsule (100 mg total) by mouth 2 (two) times daily for 15 days. 30 capsule Trevor Iha, FNP   methocarbamol (ROBAXIN) 500 MG tablet Take 1 tablet (500 mg total) by mouth 3 (three) times daily as needed for muscle spasms. 30 tablet Trevor Iha, FNP      PDMP not reviewed this encounter.   Trevor Iha,  FNP 02/14/22 1203

## 2022-02-14 NOTE — Discharge Instructions (Addendum)
Advised patient to take medication as directed with food to completion.  Advised may take Robaxin daily or as needed for accompanying muscle spasms/skeletal muscle pain of chest region.  Encouraged patient to increase daily water intake while taking these medications.  Encourage patient to avoid repetitive activities involving chest muscles for the next 5 to 7 days.  Advised if symptoms worsen and/or unresolved please follow-up with PCP or here for further evaluation.

## 2022-02-15 ENCOUNTER — Telehealth: Payer: Self-pay | Admitting: Emergency Medicine

## 2022-02-15 NOTE — Telephone Encounter (Signed)
Spoke with patient states that he is doing ok, still having some chest pains but he is taking the medicine prescribed.  If he continues he will follow up with his PCP.  Patient voices understanding.

## 2022-04-12 ENCOUNTER — Ambulatory Visit: Payer: BC Managed Care – PPO | Admitting: Physician Assistant

## 2022-05-14 ENCOUNTER — Encounter: Payer: Self-pay | Admitting: Physician Assistant

## 2022-05-14 ENCOUNTER — Ambulatory Visit (INDEPENDENT_AMBULATORY_CARE_PROVIDER_SITE_OTHER): Payer: BC Managed Care – PPO | Admitting: Physician Assistant

## 2022-05-14 VITALS — BP 136/74 | HR 76 | Ht 74.0 in | Wt 252.1 lb

## 2022-05-14 DIAGNOSIS — R0602 Shortness of breath: Secondary | ICD-10-CM

## 2022-05-14 DIAGNOSIS — F410 Panic disorder [episodic paroxysmal anxiety] without agoraphobia: Secondary | ICD-10-CM | POA: Diagnosis not present

## 2022-05-14 DIAGNOSIS — I10 Essential (primary) hypertension: Secondary | ICD-10-CM | POA: Diagnosis not present

## 2022-05-14 DIAGNOSIS — J3489 Other specified disorders of nose and nasal sinuses: Secondary | ICD-10-CM | POA: Diagnosis not present

## 2022-05-14 MED ORDER — LORAZEPAM 0.5 MG PO TABS: ORAL_TABLET | ORAL | 0 refills | Status: AC

## 2022-05-14 MED ORDER — AZELASTINE HCL 0.1 % NA SOLN: 2.0000 | Freq: Two times a day (BID) | NASAL | 1 refills | Status: AC

## 2022-05-14 MED ORDER — IPRATROPIUM-ALBUTEROL 0.5-2.5 (3) MG/3ML IN SOLN
3.0000 mL | Freq: Once | RESPIRATORY_TRACT | Status: AC
  Administered 2022-05-14: 3 mL via RESPIRATORY_TRACT

## 2022-05-14 NOTE — Patient Instructions (Signed)
Add zyrtec daily at bedtime Add azestaline nasal spray daily with nasocort Use ativan as needed for panic attacks  Panic Attack A panic attack is when you suddenly feel very afraid, uncomfortable, or nervous (anxious). A panic attack can happen when you are scared, or it may happen for no reason. A panic attack can feel like a heart attack or stroke. See your doctor when you have a panic attack to make sure you are not having a heart attack or stroke. What are the causes? Experiencing things that threaten your life, such as a war. Feeling worried or nervous for a long time (anxiety disorder). Being sad (depressed). Panic disorder. Certain medical conditions. Other causes may include: Certain medicines. Taking certain supplements. Illegal drugs. What increases the risk? Having another mental health condition. Using alcohol or drugs. Being under a lot of stress. Having events in your life that cause worry and sadness. What are the signs or symptoms? A panic attack: Starts suddenly. May last 5-10 minutes. Symptoms include one or more of these: A pounding heart. A feeling that your heart is beating in an unusual way or faster than normal (palpitations). Sweating or shaking. Feeling short of breath. Chest pain. Feeling like you may vomit (nauseous). Feeling dizzy or like you might faint. Other symptoms may include: Chills or hot flashes. Numbness or tingling in your lips, hands, or feet. Feeling confused. Fear of losing control. Fear of dying. How is this treated? A panic attack is a symptom of another condition. Treatment depends on the cause of the panic attack. If the cause is a medical problem, your doctor will treat that problem or refer you to a specialist. If the cause is emotional, you may be given medicines or referred to a counselor. If the cause is a medicine, your doctor may tell you to stop the medicine, change your dose, or take a different medicine. If the cause  is an illegal drug, treatment may involve letting the drug wear off and taking medicine to help the drug leave your body or to stop its effects. Attacks caused by heavy drug use may continue even if you stop using the drug. Most panic attacks go away after the cause is treated. Follow these instructions at home: Alcohol use Do not drink alcohol if: Your doctor tells you not to drink. You are pregnant, may be pregnant, or are planning to become pregnant. If you drink alcohol: Limit how much you have to: 0-1 drink a day for women. 0-2 drinks a day for men. Know how much alcohol is in your drink. In the [place] one drink equals one 12 oz bottle of beer (355 mL), one 5 oz glass of wine (148 mL), or one 1 oz glass of hard liquor (44 mL). General instructions  Take over-the-counter and prescription medicines only as told by your doctor. If you feel worried or nervous, try not to have caffeine. Take good care of your health. To do this: Eat healthy. Make sure to eat fresh fruits and vegetables, whole grains, lean meats, and low-fat dairy. Get enough sleep. Try to sleep for 7-8 hours each night. Exercise. Try to be active for 30 minutes 5 or more days a week. Do not smoke or use any products that contain nicotine or tobacco. If you need help quitting, ask your doctor. Keep all follow-up visits. Where to find more information Substance Abuse and Mental Health Services [facility]): [employer] [facility] [address]): http://www.maynard.net/ Contact a doctor if: Your symptoms  do not get better. Your symptoms get worse. You are not able to take your medicines as told. Get help right away if: You have thoughts of hurting yourself or others. Get help right away if you feel like you may hurt yourself or others, or have thoughts about taking your own life. Go to your nearest emergency room or: Call 911. Call the National Suicide Prevention Lifeline at (954)848-0481 or 988.  This is open 24 hours a day. Text the Crisis Text Line at (613)479-8998. Summary A panic attack is when you suddenly feel very afraid, uncomfortable, or nervous (anxious). See your doctor when you have a panic attack to make sure that you do not have another serious problem. If you feel like you may hurt yourself or others, get help right away. Call 911. This information is not intended to replace advice given to you by your health care provider. Make sure you discuss any questions you have with your health care provider. Document Revised: 01/29/2021 Document Reviewed: 01/29/2021 Elsevier Patient Education  2023 [employer].

## 2022-05-14 NOTE — Progress Notes (Signed)
Acute Office Visit  Subjective:     Patient ID: Juan Bryan, male    DOB: 07-10-1990, 31 y.o.   MRN: [id number]  Chief Complaint  Patient presents with   Shortness of Breath    Chest congestion and anxiety    HPI Patient is in today for chest congestion and anxiety.   Pt is having more chest congestion and feeling of shortness of breath. No wheezing, fever, chills, sinus pressure, ear pain. Not really tried anything to make better. Wondering if he is having more asthma exacerbations. He uses albuterol inhaler but does not seem to help all the time.   Not taking cozaar. No CP, palpitations, headaches or vision changes. Not checking BP.   Having episodes of anxiety and panic. Wants to try something as needed. When they occur he is really short of breath, sweaty and feels like he can't think.   .. 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   Class 2 obesity due to excess calories without serious comorbidity in adult 06/02/2016   Essential hypertension 08/20/2016   Varicocele 08/20/2016   Left testicular pain 08/20/2016   Precordial chest pain 04/27/2017   Mild intermittent asthma with exacerbation 04/27/2017   Seasonal allergies 04/27/2017   White coat syndrome with diagnosis of hypertension 05/18/2017   Non-restorative sleep 12/20/2017   Sleep talking 12/20/2017   Snoring 12/20/2017   No energy 12/20/2017   Panic attack 03/31/2020   Chronic left-sided low back pain with left-sided sciatica 03/31/2020   Muscle spasm of back 04/01/2020   Elevated blood pressure reading 04/04/2020   Acute middle ear effusion, right 12/22/2020   Post-viral cough syndrome 12/22/2020   Strain of rhomboid muscle 03/24/2021   Migraine with aura and without status migrainosus, not intractable 03/24/2021   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     Objective:    BP 136/74   Pulse 76   Ht 6\' 2"  (1.88 m)   Wt 252 lb 1.6 oz (114.4 kg)   SpO2 100%   PF 600 L/min Comment: green/ before neb  BMI 32.37 kg/m  BP Readings from Last 3 Encounters:  05/14/22 136/74  02/14/22 (!) 145/96  01/08/22 125/72   Wt Readings from Last 3 Encounters:  05/14/22 252 lb 1.6 oz (114.4 kg)  02/14/22 239 lb (108.4 kg)  01/08/22 250 lb (113.4 kg)    Duoneb given in office today with no improvement of symptoms.   ..    05/14/2022   11:08 AM 01/08/2022    4:11 PM 03/31/2020    4:37 PM 06/11/2019    8:37 AM 12/20/2017   10:26 AM  Depression screen PHQ 2/9  Decreased Interest 1 1 0 1 1  Down, Depressed, Hopeless 2 1 3 1 2   PHQ - 2 Score 3 2 3 2 3   Altered sleeping 0 2 3 0 0  Tired, decreased energy 2 3 3 3 3   Change in appetite 1 1 3  0 2  Feeling bad or failure about yourself  0  0 2 0 2  Trouble concentrating 2 2 3 1 2   Moving slowly or fidgety/restless 1 0 1 0 0  Suicidal thoughts 0 0 0 0 0  PHQ-9 Score 9 10 18 6 12   Difficult doing work/chores Somewhat difficult Somewhat difficult Somewhat difficult Not difficult at all Not difficult at all   ..    05/14/2022   11:08 AM 01/08/2022    4:12 PM 03/31/2020    4:38 PM 06/11/2019    8:39 AM  GAD 7 : Generalized Anxiety Score  Nervous, Anxious, on Edge 3 1 3 2   Control/stop worrying 3 1 3 2   Worry too much - different things 3 2 3 2   Trouble relaxing 2 0 2 0  Restless 1 1 1  0  Easily annoyed or irritable 3 3 1 1   Afraid - awful might happen 3 3 3 1   Total GAD 7 Score 18 11 16 8   Anxiety Difficulty Somewhat difficult Not difficult at all Somewhat difficult Not difficult at all       Physical Exam Constitutional:      Appearance: He is well-developed.  HENT:     Head: Normocephalic.     Mouth/Throat:     Mouth: Mucous membranes are moist.     Pharynx: No pharyngeal swelling or oropharyngeal exudate.  Eyes:     Pupils: Pupils are equal, round, and reactive to light.  Cardiovascular:     Rate and Rhythm: Normal rate and regular rhythm.     Heart sounds: No murmur heard. Pulmonary:     Effort: Pulmonary effort is normal.     Breath sounds: Normal breath sounds. No decreased breath sounds, wheezing or rhonchi.  Musculoskeletal:     Cervical back: Normal range of motion.  Neurological:     General: No focal deficit present.     Mental Status: He is alert.  Psychiatric:        Mood and Affect: Mood normal.           Assessment & Plan:  9/29/202114/7/2020Random was seen today for shortness of breath.  Diagnoses and all orders for this visit:  Sinus drainage -     azelastine (ASTELIN) 0.1 % nasal spray; Place 2 sprays into both nostrils 2 (two) times daily. Use in each nostril as directed  SOB (shortness of breath) -     ipratropium-albuterol (DUONEB) 0.5-2.5 (3) MG/3ML nebulizer solution 3 mL -     azelastine (ASTELIN) 0.1 % nasal spray; Place 2 sprays into both nostrils 2 (two) times daily. Use in each nostril as directed  Essential hypertension  Panic attacks -     LORazepam (ATIVAN) 0.5 MG tablet; Take as needed for anxiety attacks as needed no more than once a day.   Pulse ox 100 percent Peak flow 600 and with no improvement after nebulizer Lungs sound great Seems more like allergies and sinus drainage Azelastine nasal spray sent to pharmacy Add zyrtec at bedtime Pt does not tolerate prednisone very well so will try to avoid Consider sinus rinses at home Follow up as needed or if symptoms worsen or persist  Discussed panic attacks Ativian as needed, use sparingly Consider counseling  Consider daily medication if becoming more regular  BP ok  today Off cozaar Will monitor  , PA-C

## 2022-05-21 ENCOUNTER — Encounter: Payer: Self-pay | Admitting: Physician Assistant

## 2022-10-13 ENCOUNTER — Encounter: Payer: Self-pay | Admitting: Physician Assistant

## 2022-10-13 DIAGNOSIS — Z20822 Contact with and (suspected) exposure to covid-19: Secondary | ICD-10-CM | POA: Diagnosis not present

## 2022-10-13 DIAGNOSIS — R079 Chest pain, unspecified: Secondary | ICD-10-CM | POA: Diagnosis not present

## 2022-10-13 DIAGNOSIS — R070 Pain in throat: Secondary | ICD-10-CM | POA: Diagnosis not present

## 2022-10-13 DIAGNOSIS — R0602 Shortness of breath: Secondary | ICD-10-CM | POA: Diagnosis not present

## 2022-10-13 DIAGNOSIS — J020 Streptococcal pharyngitis: Secondary | ICD-10-CM | POA: Diagnosis not present

## 2022-10-13 DIAGNOSIS — J45901 Unspecified asthma with (acute) exacerbation: Secondary | ICD-10-CM | POA: Diagnosis not present

## 2022-10-25 ENCOUNTER — Other Ambulatory Visit: Payer: Self-pay | Admitting: Physician Assistant

## 2022-10-25 DIAGNOSIS — I10 Essential (primary) hypertension: Secondary | ICD-10-CM

## 2022-10-27 ENCOUNTER — Ambulatory Visit (INDEPENDENT_AMBULATORY_CARE_PROVIDER_SITE_OTHER): Payer: BC Managed Care – PPO | Admitting: Physician Assistant

## 2022-10-27 ENCOUNTER — Encounter: Payer: Self-pay | Admitting: Physician Assistant

## 2022-10-27 VITALS — BP 150/92 | HR 74 | Ht 74.0 in | Wt 253.0 lb

## 2022-10-27 DIAGNOSIS — L723 Sebaceous cyst: Secondary | ICD-10-CM | POA: Diagnosis not present

## 2022-10-27 DIAGNOSIS — J4541 Moderate persistent asthma with (acute) exacerbation: Secondary | ICD-10-CM

## 2022-10-27 DIAGNOSIS — I10 Essential (primary) hypertension: Secondary | ICD-10-CM | POA: Diagnosis not present

## 2022-10-27 DIAGNOSIS — M62838 Other muscle spasm: Secondary | ICD-10-CM

## 2022-10-27 MED ORDER — BUDESONIDE-FORMOTEROL FUMARATE 160-4.5 MCG/ACT IN AERO: 2.0000 | INHALATION_SPRAY | Freq: Two times a day (BID) | RESPIRATORY_TRACT | 2 refills | Status: DC

## 2022-10-27 MED ORDER — LOSARTAN POTASSIUM 25 MG PO TABS: 25.0000 mg | ORAL_TABLET | Freq: Every day | ORAL | 3 refills | Status: DC

## 2022-10-27 NOTE — Patient Instructions (Addendum)
Epidermoid Cyst  An epidermoid cyst, also known as epidermal cyst, is a sac made of skin tissue. The sac contains a substance called keratin. Keratin is a protein that is normally secreted through the hair follicles. When keratin becomes trapped in the top layer of skin (epidermis), it can form an epidermoid cyst. Epidermoid cysts can be found anywhere on your body. These cysts are usually harmless (benign), and they may not cause symptoms unless they become inflamed or infected. What are the causes? This condition may be caused by: A blocked hair follicle. A hair that curls and re-enters the skin instead of growing straight out of the skin (ingrown hair). A blocked pore. Irritated skin. An injury to the skin. Certain conditions that are passed along from parent to child (inherited). Human papillomavirus (HPV). This happens rarely when cysts occur on the bottom of the feet. Long-term (chronic) sun damage to the skin. What increases the risk? The following factors may make you more likely to develop an epidermoid cyst: Having acne. Being male. Having an injury to the skin. Being past puberty. Having certain rare genetic disorders. What are the signs or symptoms? The only symptom of this condition may be a small, painless lump underneath the skin. When an epidermal cyst ruptures, it may become inflamed. True infection in cysts is rare. Symptoms may include: Redness. Inflammation. Tenderness. Warmth. Keratin draining from the cyst. Keratin is grayish-white, bad-smelling substance. Pus draining from the cyst. How is this diagnosed? This condition is diagnosed with a physical exam. In some cases, you may have a sample of tissue (biopsy) taken from your cyst to be examined under a microscope or tested for bacteria. You may be referred to a health care provider who specializes in skin care (dermatologist). How is this treated? If a cyst becomes inflamed, treatment may include: Opening and  draining the cyst, done by a health care provider. After draining, minor surgery to remove the rest of the cyst may be done. Taking antibiotic medicine. Having injections of medicines (steroids) that help to reduce inflammation. Having surgery to remove the cyst. Surgery may be done if the cyst: Becomes large. Bothers you. Has a chance of turning into cancer. Do not try to open a cyst yourself. Follow these instructions at home: Medicines If you were prescribed an antibiotic medicine, take it it as told by your health care provider. Do not stop using the antibiotic even if you start to feel better. Take over-the-counter and prescription medicines only as told by your health care provider. General instructions Keep the area around your cyst clean and dry. Wear loose, dry clothing. Avoid touching your cyst. Check your cyst every day for signs of infection. Check for: Redness, swelling, or pain. Fluid or blood. Warmth. Pus or a bad smell. Keep all follow-up visits. This is important. How is this prevented? Wear clean, dry, clothing. Avoid wearing tight clothing. Keep your skin clean and dry. Take showers or baths every day. Contact a health care provider if: Your cyst develops symptoms of infection. Your condition is not improving or is getting worse. You develop a cyst that looks different from other cysts you have had. You have a fever. Get help right away if: Redness spreads from the cyst into the surrounding area. Summary An epidermoid cyst is a sac made of skin tissue. These cysts are usually harmless (benign), and they may not cause symptoms unless they become inflamed. If a cyst becomes inflamed, treatment may include surgery to open and drain the   cyst, or to remove it. Treatment may also include medicines by mouth or through an injection. Take over-the-counter and prescription medicines only as told by your health care provider. If you were prescribed an antibiotic medicine,  take it as told by your health care provider. Do not stop using the antibiotic even if you start to feel better. Contact a health care provider if your condition is not improving or is getting worse. Keep all follow-up visits as told by your health care provider. This is important. This information is not intended to replace advice given to you by your health care provider. Make sure you discuss any questions you have with your health care provider. Document Revised: 09/26/2019 Document Reviewed: 09/26/2019 Elsevier Patient Education  2023 Elsevier Inc.  

## 2022-10-27 NOTE — Progress Notes (Signed)
Acute Office Visit  Subjective:     Patient ID: Juan Bryan, male    DOB: September 22, 1990, 32 y.o.   MRN: [id number]  Chief Complaint  Patient presents with   Follow-up    HPI Patient is in today for persistent SOB and chest tightness after strep throat and antibiotic.   Pt does check BP at home and ranges 120s-130s over 70-80s. He is compliant with medication. He does have occasional heart "tickle" but no pain. He has had negative cardiac work up in the past. Seems to happen more with working out chest.   Just finished abx and continues to have cough and chest tightness. Hx of reactive airway disease. Has albuterol which seems to help. Taking allergy medication daily.   His wife wants bump on back looked at again. No pain. Not getting bigger. Had for many years.  .. 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/18/2016   Class 2 obesity due to excess calories without serious comorbidity in adult 06/02/2016   Essential hypertension 08/20/2016   Varicocele 08/20/2016   Left testicular pain 08/20/2016   Precordial chest pain 04/27/2017   Mild intermittent asthma with exacerbation 04/27/2017   Seasonal allergies 04/27/2017   White coat syndrome with diagnosis of hypertension 05/18/2017   Non-restorative sleep 12/20/2017   Sleep talking 12/20/2017   Snoring 12/20/2017   No energy 12/20/2017   Panic attack 03/31/2020   Chronic left-sided low back pain with left-sided sciatica 03/31/2020   Muscle spasm of back 04/01/2020   Elevated blood pressure reading 04/04/2020   Acute middle ear effusion, right 12/22/2020   Post-viral cough syndrome 12/22/2020   Strain of rhomboid muscle 03/24/2021   Migraine with aura and without status migrainosus, not intractable 03/24/2021   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   Intermittent  alternating exotropia 01/11/2022   Spasm of eye muscle, bilateral 01/11/2022   Nocturia 01/13/2022   Routine physical examination 01/13/2022   Sebaceous cyst 10/29/2022   Muscle spasm 10/29/2022   Resolved Ambulatory Problems    Diagnosis Date Noted   No Resolved Ambulatory Problems   Past Medical History:  Diagnosis Date   Asthma 07/09/2013     ROS  See HPI>     Objective:    BP (!) 150/92   Pulse 74   Ht 6\' 2"  (1.88 m)   Wt 253 lb (114.8 kg)   SpO2 100%   BMI 32.48 kg/m  BP Readings from Last 3 Encounters:  10/27/22 (!) 150/92  05/14/22 136/74  02/14/22 (!) 145/96   Wt Readings from Last 3 Encounters:  10/27/22 253 lb (114.8 kg)  05/14/22 252 lb 1.6 oz (114.4 kg)  02/14/22 239 lb (108.4 kg)      Physical Exam Constitutional:      Appearance: Normal appearance.  HENT:     Head: Normocephalic.  Cardiovascular:     Rate and Rhythm: Normal rate and regular rhythm.  Pulmonary:     Effort: Pulmonary effort is normal.     Breath sounds: Normal breath sounds.  Skin:    Comments: Mid back on the midline 2cm by 2cm firm superficial mass, no erythema, warmth, or swelling associated.   Neurological:     General: No focal deficit present.     Mental Status: He is alert and oriented to person, place, and time.  Psychiatric:  Mood and Affect: Mood normal.         Assessment & Plan:  Marland KitchenMarland KitchenJaidin was seen today for follow-up.  Diagnoses and all orders for this visit:  White coat syndrome with diagnosis of hypertension -     losartan (COZAAR) 25 MG tablet; Take 1 tablet (25 mg total) by mouth daily.  Moderate persistent reactive airway disease with acute exacerbation -     budesonide-formoterol (SYMBICORT) 160-4.5 MCG/ACT inhaler; Inhale 2 puffs into the lungs 2 (two) times daily.  Sebaceous cyst  Muscle spasm   BP elevated today but home readings good Hx of white coat HTN Refilled losaartan  Suspect tickle when lifting weights is muscle  spasm Stretch before working out Start magnesium at bedtime 400mg  Warm compresses as needed Follow up with any changes to symptoms   Discussed reaction airway disease and allergies Vitals look good today Use symbicort regularly through allergy season and then stop Continue on anti-histamine and singulair Pt does not respond well to oral prednisone  Discussed benign nature of sebaceous cyst Can schedule appt for removal or dermatology referral HO given  Tandy Gaw, PA-C

## 2022-10-29 DIAGNOSIS — M62838 Other muscle spasm: Secondary | ICD-10-CM | POA: Insufficient documentation

## 2022-10-29 DIAGNOSIS — L723 Sebaceous cyst: Secondary | ICD-10-CM | POA: Insufficient documentation

## 2022-10-31 ENCOUNTER — Encounter: Payer: Self-pay | Admitting: Physician Assistant

## 2022-11-01 NOTE — Telephone Encounter (Signed)
Agree with patient needs additional workup would recommend that he schedule an appointment.

## 2022-11-02 ENCOUNTER — Ambulatory Visit (INDEPENDENT_AMBULATORY_CARE_PROVIDER_SITE_OTHER): Payer: BC Managed Care – PPO | Admitting: Sports Medicine

## 2022-11-02 DIAGNOSIS — R1011 Right upper quadrant pain: Secondary | ICD-10-CM

## 2022-11-02 DIAGNOSIS — Q433 Congenital malformations of intestinal fixation: Secondary | ICD-10-CM | POA: Diagnosis not present

## 2022-11-02 NOTE — Assessment & Plan Note (Addendum)
32 year old male, has had right upper quadrant abdominal pain for the past 3 days No nausea, vomiting, diarrhea. No fevers, chills, on exam he does have some fullness right lateral abdominal as well as right upper quadrant, positive Murphy sign. Adding CBC with differential, CMP, amylase, lipase, abdominal ultrasound, if we do get a solid diagnosis we will proceed with CT abdomen and pelvis with oral and IV contrast.  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  I would like general surgery to weigh in.  Update: [facility] feels this is too complex for them, we will switch to [facility].

## 2022-11-02 NOTE — Progress Notes (Addendum)
    Procedures performed today:    None.  Independent interpretation of notes and tests performed by another provider:   None.  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 No fevers, chills, on exam he does have some fullness right lateral abdominal as well as right upper quadrant, positive Murphy sign. Adding CBC with differential, CMP, amylase, lipase, abdominal ultrasound, if we do get a solid diagnosis we will proceed with CT abdomen and pelvis with oral and IV contrast.  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  I would like general surgery to weigh in.  Update: [facility] feels this is too complex for them, we will switch to [facility].    ____________________________________________ Ihor Austin. Benjamin Stain, M.D., ABFM., CAQSM., AME. Primary Care and Sports Medicine [facility]  Adjunct Professor of Family Medicine  [place] [employer]  Restaurant manager, fast food

## 2022-11-03 LAB — CBC WITH DIFFERENTIAL/PLATELET
Absolute Monocytes: 336 cells/uL (ref 200–950)
Basophils Absolute: 48 cells/uL (ref 0–200)
Basophils Relative: 0.8 %
Eosinophils Absolute: 132 cells/uL (ref 15–500)
Eosinophils Relative: 2.2 %
HCT: 46.2 % (ref 38.5–50.0)
Hemoglobin: 15.9 g/dL (ref 13.2–17.1)
Lymphs Abs: 1860 cells/uL (ref 850–3900)
MCH: 29.3 pg (ref 27.0–33.0)
MCHC: 34.4 g/dL (ref 32.0–36.0)
MCV: 85.2 fL (ref 80.0–100.0)
MPV: 9.6 fL (ref 7.5–12.5)
Monocytes Relative: 5.6 %
Neutro Abs: 3624 cells/uL (ref 1500–7800)
Neutrophils Relative %: 60.4 %
Platelets: 243 10*3/uL (ref 140–400)
RBC: 5.42 10*6/uL (ref 4.20–5.80)
RDW: 12.3 % (ref 11.0–15.0)
Total Lymphocyte: 31 %
WBC: 6 10*3/uL (ref 3.8–10.8)

## 2022-11-03 LAB — COMPREHENSIVE METABOLIC PANEL
AG Ratio: 2.2 (calc) (ref 1.0–2.5)
ALT: 17 U/L (ref 9–46)
AST: 17 U/L (ref 10–40)
Albumin: 4.7 g/dL (ref 3.6–5.1)
Alkaline phosphatase (APISO): 70 U/L (ref 36–130)
BUN: 21 mg/dL (ref 7–25)
CO2: 30 mmol/L (ref 20–32)
Calcium: 9.9 mg/dL (ref 8.6–10.3)
Chloride: 101 mmol/L (ref 98–110)
Creat: 0.89 mg/dL (ref 0.60–1.26)
Globulin: 2.1 g/dL (calc) (ref 1.9–3.7)
Glucose, Bld: 76 mg/dL (ref 65–99)
Potassium: 4.5 mmol/L (ref 3.5–5.3)
Sodium: 140 mmol/L (ref 135–146)
Total Bilirubin: 0.5 mg/dL (ref 0.2–1.2)
Total Protein: 6.8 g/dL (ref 6.1–8.1)

## 2022-11-03 LAB — URINALYSIS W MICROSCOPIC + REFLEX CULTURE
Bacteria, UA: NONE SEEN /HPF
Bilirubin Urine: NEGATIVE
Glucose, UA: NEGATIVE
Hgb urine dipstick: NEGATIVE
Hyaline Cast: NONE SEEN /LPF
Ketones, ur: NEGATIVE
Leukocyte Esterase: NEGATIVE
Nitrites, Initial: NEGATIVE
Protein, ur: NEGATIVE
RBC / HPF: NONE SEEN /HPF (ref 0–2)
Specific Gravity, Urine: 1.013 (ref 1.001–1.035)
Squamous Epithelial / HPF: NONE SEEN /HPF (ref <=5)
WBC, UA: NONE SEEN /HPF (ref 0–5)
pH: 6 (ref 5.0–8.0)

## 2022-11-03 LAB — LIPASE: Lipase: 24 U/L (ref 7–60)

## 2022-11-03 LAB — AMYLASE: Amylase: 48 U/L (ref 21–101)

## 2022-11-03 LAB — NO CULTURE INDICATED

## 2022-11-04 ENCOUNTER — Ambulatory Visit (INDEPENDENT_AMBULATORY_CARE_PROVIDER_SITE_OTHER): Payer: BC Managed Care – PPO

## 2022-11-04 ENCOUNTER — Encounter: Payer: Self-pay | Admitting: Sports Medicine

## 2022-11-04 DIAGNOSIS — R1011 Right upper quadrant pain: Secondary | ICD-10-CM

## 2022-11-09 ENCOUNTER — Encounter: Payer: Self-pay | Admitting: Physician Assistant

## 2022-11-09 DIAGNOSIS — R1011 Right upper quadrant pain: Secondary | ICD-10-CM

## 2022-11-10 ENCOUNTER — Ambulatory Visit (INDEPENDENT_AMBULATORY_CARE_PROVIDER_SITE_OTHER): Payer: BC Managed Care – PPO

## 2022-11-10 DIAGNOSIS — R1011 Right upper quadrant pain: Secondary | ICD-10-CM | POA: Diagnosis not present

## 2022-11-10 DIAGNOSIS — R109 Unspecified abdominal pain: Secondary | ICD-10-CM | POA: Diagnosis not present

## 2022-11-10 MED ORDER — IOHEXOL 300 MG/ML  SOLN
100.0000 mL | Freq: Once | INTRAMUSCULAR | Status: AC | PRN
  Administered 2022-11-10: 100 mL via INTRAVENOUS

## 2022-11-15 NOTE — Addendum Note (Signed)
Addended by: Monica Becton on: 11/15/2022 07:23 AM   Modules accepted: Orders

## 2022-11-16 ENCOUNTER — Ambulatory Visit (INDEPENDENT_AMBULATORY_CARE_PROVIDER_SITE_OTHER): Payer: BC Managed Care – PPO | Admitting: Physician Assistant

## 2022-11-16 VITALS — BP 151/96 | HR 65 | Ht 74.0 in | Wt 253.0 lb

## 2022-11-16 DIAGNOSIS — Q433 Congenital malformations of intestinal fixation: Secondary | ICD-10-CM

## 2022-11-16 DIAGNOSIS — R1011 Right upper quadrant pain: Secondary | ICD-10-CM

## 2022-11-16 DIAGNOSIS — Z23 Encounter for immunization: Secondary | ICD-10-CM

## 2022-11-16 DIAGNOSIS — K219 Gastro-esophageal reflux disease without esophagitis: Secondary | ICD-10-CM | POA: Diagnosis not present

## 2022-11-16 MED ORDER — OMEPRAZOLE 40 MG PO CPDR: 40.0000 mg | DELAYED_RELEASE_CAPSULE | Freq: Every day | ORAL | 1 refills | Status: AC

## 2022-11-16 NOTE — Progress Notes (Unsigned)
Established Patient Office Visit  Subjective   Patient ID: Juan Bryan, male    DOB: 1990/10/08  Age: 32 y.o. 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 Pt has appt with [facility] on June 6th. He is worried about waiting that long and when to follow up sooner or be worried. Right upper abdomen pain occurred once before and then quickly went away. Started again on 4/28 and has persisted. His pain will go from 4/10 to 8/10. He denies any constipation, melena, hematochezia. Eating does trigger more pain. Denies any vomiting but his reflux has been worse. Denies any urinary symptoms. Taking tylenol for pain mostly.   He does have compliant. He was asked to provide urine sample and multiple cups were on the same tray with identifiers. He felt uncomfortable leaving his sample on same tray.    .. 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/18/2016   Class 2 obesity due to excess calories without serious comorbidity in adult 06/02/2016   Essential hypertension 08/20/2016   Varicocele 08/20/2016   Left testicular pain 08/20/2016   Precordial chest pain 04/27/2017   Mild intermittent asthma with exacerbation 04/27/2017   Seasonal allergies 04/27/2017   White coat syndrome with diagnosis of hypertension 05/18/2017   Non-restorative sleep 12/20/2017   Sleep talking 12/20/2017   Snoring 12/20/2017   No energy 12/20/2017   Panic attack 03/31/2020   Chronic left-sided low back pain with left-sided sciatica 03/31/2020   Muscle spasm of back 04/01/2020   Elevated blood pressure reading 04/04/2020   Acute middle ear effusion,  right 12/22/2020   Post-viral cough syndrome 12/22/2020   Strain of rhomboid muscle 03/24/2021   Migraine with aura and without status migrainosus, not intractable 03/24/2021   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   Intermittent alternating exotropia 01/11/2022   Spasm of eye muscle, bilateral 01/11/2022   Nocturia 01/13/2022   Routine physical examination 01/13/2022   Sebaceous cyst 10/29/2022   Muscle spasm 10/29/2022   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  Objective:     BP (!) 151/96   Pulse 65   Ht 6\' 2"  (1.88 m)   Wt 253 lb (114.8 kg)   SpO2 99%   BMI 32.48 kg/m  BP Readings from Last 3 Encounters:  11/16/22 (!) 151/96  10/27/22 (!) 150/92  05/14/22 136/74   Wt Readings from Last 3 Encounters:  11/16/22 253 lb (114.8 kg)  10/27/22 253 lb (114.8 kg)  05/14/22 252 lb 1.6 oz (114.4 kg)      Physical Exam Constitutional:      Appearance: Normal appearance.  HENT:     Head: Normocephalic.  Cardiovascular:     Rate and Rhythm: Normal rate and regular rhythm.     Pulses: Normal pulses.  Pulmonary:     Effort: Pulmonary effort is normal.  Abdominal:     General: There is no distension.  Palpations: Abdomen is soft. There is no mass.     Tenderness: There is abdominal tenderness. There is no right CVA tenderness, left CVA tenderness, guarding or rebound.     Hernia: No hernia is present.     Comments: Tenderness over entire abdomen but worse over RUQ and RLQ and epigastric area.  Bowel sounds present and in all quadrants.   Neurological:     General: No focal deficit present.     Mental Status: He is alert and oriented to person, place, and time.  Psychiatric:     Comments: Anxious about health and symptoms          Assessment & Plan:  Marland KitchenMarland KitchenNyle was seen today for  follow-up.  Diagnoses and all orders for this visit:  Chilaiditi's syndrome -     omeprazole (PRILOSEC) 40 MG capsule; Take 1 capsule (40 mg total) by mouth daily.  Acute abdominal pain in right upper quadrant  Need for Tdap vaccination -     Tdap vaccine greater than or equal to 7yo IM  Gastroesophageal reflux disease without esophagitis -     omeprazole (PRILOSEC) 40 MG capsule; Take 1 capsule (40 mg total) by mouth daily.   No acute red flag symptoms today except for persistent pain  Discussed concern for partial or full bowel obstruction and signs and symptoms of that HO given Right now he is having bowel movements and I here bowel sounds Called surgeon and right now cannot get him in any sooner but suggested if pain is worsening to go to ED for assessment for emergency surgery Right now I think patient is not candidate for emergency surgery but if pain continues to worsen go to ER Ok to use tylenol now for pain Added omeprazole for reflux symptoms daily Added stool softener colace daily  Ok to use miralax 1 capful daily to keep stools soft If starts to vomit or not have bowel movement/gas or pain increasing and not relieving go to ED.   Discussed concerns of collecting urine samples and storage. Will give to clinical lead to discuss with office.   Spent 38 minutes reviewing chart, coordinating care, discussing treatment plan with patient.     Tandy Gaw, PA-C

## 2022-11-16 NOTE — Patient Instructions (Addendum)
Miralax 1 capful daily in morning with 4oz of juice to keep bowels moving.  Start colace daily Start omeprazole in the morning Tylenol for pain   Bowel Obstruction A bowel obstruction is a blockage in the small or large bowel. The bowel is also called the intestine. It is a long tube that connects the stomach to the anus. When a person eats and drinks, food and fluids go from the mouth to the stomach to the small bowel. This is where most of the nutrients in the food and fluids are absorbed. After the small bowel, material passes through the large bowel for further absorption until any leftover material leaves the body as stool (feces) through the anus during a bowel movement. A bowel obstruction will prevent food and fluids from passing through the bowel as they normally do during digestion. The bowel can become partially or completely blocked. If this condition is not treated, it can be dangerous because the bowel could rupture. What are the causes? Common causes of this condition include: Scar tissue in the body (adhesions) from previous surgery or treatment with high-energy X-rays (radiation). Recent surgery. This may cause the movements of the bowel to slow down and cause food to block the intestine. Inflammatory bowel disease, such as Crohn's disease or diverticulitis. Growths or tumors. A bulging organ or tissue (hernia). Twisting of the bowel (volvulus). A swallowed object (foreign body). Slipping of a part of the bowel into another part (intussusception). What are the signs or symptoms? Symptoms of this condition include: Pain in the abdomen. Depending on the degree of obstruction, pain may be: Mild or severe. Dull cramping or sharp pain. In one area or in the entire abdomen. Nausea and vomiting. Vomit may be greenish or a yellow bile color. Bloating in the abdomen. Constipation. Being unable to pass gas. Frequent belching. Diarrhea. This may occur if the obstruction is partial  and runny stool is able to leak around the obstruction. How is this diagnosed? This condition may be diagnosed based on: A physical exam. Your medical history. Exams to look into the small intestine or the large intestine (endoscopy or colonoscopy). Imaging tests of the abdomen or pelvis, such as X-ray or CT scan. Blood or urine tests. How is this treated? Treatment for this condition depends on the cause and severity of the problem. Treatment may include: Fluids and pain medicines that are given through an IV. Your health care provider may tell you not to eat or drink if you have nausea or vomiting. A clear liquid diet. You may be asked to consume a clear liquid diet for several days. This allows the bowel to rest. Placement of a small tube (nasogastric tube) through the nose, down the throat, and into the stomach. This may relieve pain, discomfort, and nausea by removing blocked air and fluids from the stomach. It can also help the obstruction clear up faster. Surgery. This may be required if other treatments do not work. Surgery may be required for: Bowel obstruction from a hernia. This can be an emergency procedure. Scar tissue that causes frequent or severe obstructions. Follow these instructions at home: Medicines Take over-the-counter and prescription medicines only as told by your health care provider. If you were prescribed an antibiotic medicine, take it as told by your health care provider. Do not stop taking the antibiotic even if you start to feel better. General instructions Follow instructions from your health care provider about eating and drinking restrictions. You may need to avoid solid foods  and drink only clear liquids until your condition improves. Return to your normal activities as told by your health care provider. Ask your health care provider what activities are safe for you. Rest as told by your health care provider. Avoid sitting for a long time without moving.  Get up to take short walks every 1-2 hours. This is important to improve blood flow and breathing. Ask for help if you feel weak or unsteady. Keep all follow-up visits. This is important. How is this prevented? After having a bowel obstruction, you are more likely to have another. You may do the following things to prevent another obstruction: If you have a long-term (chronic) disease, pay attention to your symptoms and contact your health care provider if you have questions or concerns. Avoid becoming constipated. You may need to take these actions to prevent or treat constipation: Drink enough fluid to keep your urine pale yellow. Take over-the-counter or prescription medicines. Eat foods that are high in fiber, such as beans, whole grains, and fresh fruits and vegetables. Limit foods that are high in fat and processed sugars, such as fried or sweet foods. Stay active. Exercise for 30 minutes or more, 5 or more days each week. Ask your health care provider which exercises are safe for you. Avoid stress. Find ways to reduce stress, such as meditation, exercise, or taking time for activities that relax you. Instead of eating three large meals each day, eat three small meals with three small snacks. Work with a Data processing manager to make a healthy meal plan that works for you. Do not use any products that contain nicotine or tobacco. These products include cigarettes, chewing tobacco, and vaping devices, such as e-cigarettes. If you need help quitting, ask your health care provider. Contact a health care provider if: You have a fever. You have chills. Get help right away if: You have increased pain or cramping. You vomit blood. You have uncontrolled vomiting or nausea. You cannot drink fluids because of vomiting or pain. You become confused. You begin feeling very thirsty (dehydrated). You have severe bloating. You feel extremely weak or you faint. Summary A bowel obstruction is a blockage in the  small or large bowel. A bowel obstruction will prevent food and fluids from passing through the bowel as they normally do during digestion. Treatment for this condition depends on the cause and severity of the problem. It may include fluids and pain medicines through an IV, a simple diet, a nasogastric tube, or surgery. Follow instructions from your health care provider about eating restrictions. You may need to avoid solid foods and consume only clear liquids until your condition improves. This information is not intended to replace advice given to you by your health care provider. Make sure you discuss any questions you have with your health care provider. Document Revised: 08/03/2020 Document Reviewed: 08/03/2020 Elsevier Patient Education  2023 [employer].

## 2022-11-17 ENCOUNTER — Encounter: Payer: Self-pay | Admitting: Physician Assistant

## 2022-12-03 NOTE — Addendum Note (Signed)
Addended by: Monica Becton on: 12/03/2022 05:34 PM   Modules accepted: Orders

## 2022-12-06 ENCOUNTER — Encounter: Payer: Self-pay | Admitting: Physician Assistant

## 2022-12-10 DIAGNOSIS — R1011 Right upper quadrant pain: Secondary | ICD-10-CM | POA: Diagnosis not present

## 2023-01-08 DIAGNOSIS — R1011 Right upper quadrant pain: Secondary | ICD-10-CM | POA: Diagnosis not present

## 2023-01-12 DIAGNOSIS — R1011 Right upper quadrant pain: Secondary | ICD-10-CM | POA: Diagnosis not present

## 2023-01-13 ENCOUNTER — Other Ambulatory Visit: Payer: Self-pay | Admitting: Physician Assistant

## 2023-03-30 DIAGNOSIS — R002 Palpitations: Secondary | ICD-10-CM | POA: Diagnosis not present

## 2023-03-30 DIAGNOSIS — R209 Unspecified disturbances of skin sensation: Secondary | ICD-10-CM | POA: Diagnosis not present

## 2023-03-30 DIAGNOSIS — M545 Low back pain, unspecified: Secondary | ICD-10-CM | POA: Diagnosis not present

## 2023-04-06 DIAGNOSIS — R002 Palpitations: Secondary | ICD-10-CM | POA: Diagnosis not present

## 2023-04-06 DIAGNOSIS — R5383 Other fatigue: Secondary | ICD-10-CM | POA: Diagnosis not present

## 2023-04-06 DIAGNOSIS — Z833 Family history of diabetes mellitus: Secondary | ICD-10-CM | POA: Diagnosis not present

## 2023-04-06 DIAGNOSIS — R42 Dizziness and giddiness: Secondary | ICD-10-CM | POA: Diagnosis not present

## 2023-04-06 DIAGNOSIS — R200 Anesthesia of skin: Secondary | ICD-10-CM | POA: Diagnosis not present

## 2023-04-14 DIAGNOSIS — M545 Low back pain, unspecified: Secondary | ICD-10-CM | POA: Diagnosis not present

## 2023-05-12 DIAGNOSIS — M5416 Radiculopathy, lumbar region: Secondary | ICD-10-CM | POA: Diagnosis not present

## 2023-05-12 DIAGNOSIS — M47816 Spondylosis without myelopathy or radiculopathy, lumbar region: Secondary | ICD-10-CM | POA: Diagnosis not present

## 2023-05-12 DIAGNOSIS — M542 Cervicalgia: Secondary | ICD-10-CM | POA: Diagnosis not present

## 2023-05-19 DIAGNOSIS — M47816 Spondylosis without myelopathy or radiculopathy, lumbar region: Secondary | ICD-10-CM | POA: Diagnosis not present

## 2023-05-23 ENCOUNTER — Encounter: Payer: Self-pay | Admitting: Physician Assistant

## 2023-05-23 ENCOUNTER — Telehealth: Payer: Self-pay | Admitting: Physician Assistant

## 2023-05-23 ENCOUNTER — Ambulatory Visit (INDEPENDENT_AMBULATORY_CARE_PROVIDER_SITE_OTHER): Payer: BC Managed Care – PPO | Admitting: Physician Assistant

## 2023-05-23 VITALS — BP 142/84 | HR 76 | Ht 74.0 in | Wt 255.0 lb

## 2023-05-23 DIAGNOSIS — Z23 Encounter for immunization: Secondary | ICD-10-CM

## 2023-05-23 DIAGNOSIS — R002 Palpitations: Secondary | ICD-10-CM | POA: Diagnosis not present

## 2023-05-23 DIAGNOSIS — Z9189 Other specified personal risk factors, not elsewhere classified: Secondary | ICD-10-CM

## 2023-05-23 DIAGNOSIS — Z6832 Body mass index (BMI) 32.0-32.9, adult: Secondary | ICD-10-CM

## 2023-05-23 DIAGNOSIS — R0789 Other chest pain: Secondary | ICD-10-CM

## 2023-05-23 DIAGNOSIS — I10 Essential (primary) hypertension: Secondary | ICD-10-CM

## 2023-05-23 DIAGNOSIS — G478 Other sleep disorders: Secondary | ICD-10-CM | POA: Diagnosis not present

## 2023-05-23 DIAGNOSIS — E6609 Other obesity due to excess calories: Secondary | ICD-10-CM

## 2023-05-23 DIAGNOSIS — R0602 Shortness of breath: Secondary | ICD-10-CM | POA: Diagnosis not present

## 2023-05-23 DIAGNOSIS — E66811 Obesity, class 1: Secondary | ICD-10-CM

## 2023-05-23 DIAGNOSIS — F411 Generalized anxiety disorder: Secondary | ICD-10-CM

## 2023-05-23 MED ORDER — FLUOXETINE HCL 10 MG PO CAPS: 10.0000 mg | ORAL_CAPSULE | Freq: Every day | ORAL | 1 refills | Status: DC

## 2023-05-23 NOTE — Patient Instructions (Addendum)
Will get sleep study- done at home.  Get labs today.  Start prozac daily at bedtime.

## 2023-05-23 NOTE — Progress Notes (Unsigned)
Acute Office Visit  Subjective:     Patient ID: Juan Bryan, male    DOB: Apr 05, 1991, 31 y.o.   MRN: [id number]  Chief Complaint  Patient presents with   Medical Management of Chronic Issues         HPI Patient is in today for heart racing in middle of night starting 2 months ago. Sometimes occurs a couple times a wk and sometimes doesn't occur at all in a wk. Stops right after he wakes up. Accompanying symptoms of SOB, chest pressure, and throat pressure. He reports it is more difficult to breathe while laying down. Patient also reports daytime sleepiness and snoring. He takes day time naps and symptoms occur during these naps as well. His wife noticed him making choking noises while sleeping once. He tried albuterol once and did not notice a difference. PMH of asthma and reflux. He does not eat right before bed. He reports that he usually feels well rested right after waking up. Denies trouble falling asleep, wheezing, nightmares, recent illness, and daytime symptoms. He has never had a sleep study. He had a recent EKG from urgent care which showed no abnormalities.   BP elevated today. PMH of white coat HTN. At home SBP usually around 125. Patient reports right "numbing" headaches.    Patient reports increased anxiety for the past year. He reports chest tightness during anxious times. His first  baby is arriving in February 2025 and his father has cancer, which are making his anxiety symptoms worse.  Review of Systems  Eyes:  Negative for blurred vision.  Respiratory:  Positive for shortness of breath.   Cardiovascular:  Positive for chest pain, palpitations and orthopnea.  Gastrointestinal:  Negative for nausea and vomiting.  Neurological:  Positive for headaches.  Psychiatric/Behavioral:  The patient is nervous/anxious.         Objective:    BP (!) 142/84 (BP Location: Left Arm)   Pulse 76   Ht 6\' 2"  (1.88 m)   Wt 115.7 kg   SpO2 99%   BMI 32.74 kg/m  BP Readings from  Last 3 Encounters:  05/23/23 (!) 142/84  11/16/22 (!) 151/96  10/27/22 (!) 150/92      Physical Exam HENT:     Mouth/Throat:     Comments: Mallampati class 4 Cardiovascular:     Rate and Rhythm: Normal rate and regular rhythm.     Heart sounds: Normal heart sounds.  Pulmonary:     Effort: Pulmonary effort is normal.     Breath sounds: Normal breath sounds.  Neurological:     Mental Status: He is alert.   STOP-Bang Score:  6  { Consider Dx Sleep Disordered Breathing or Sleep Apnea  ICD G47.33          :1}   No results found for any visits on 05/23/23.      Assessment & Plan:   Problem List Items Addressed This Visit       Cardiovascular and Mediastinum   White coat syndrome with diagnosis of hypertension     Nervous and Auditory   Non-restorative sleep   Other Visit Diagnoses     Palpitations    -  Primary   Relevant Orders   CMP14+EGFR   CBC w/Diff/Platelet   TSH   Chest tightness       Relevant Orders   CMP14+EGFR   CBC w/Diff/Platelet   TSH   SOB (shortness of breath)       GAD (generalized anxiety  disorder)       Relevant Medications   FLUoxetine (PROZAC) 10 MG capsule   Other Relevant Orders   CMP14+EGFR   CBC w/Diff/Platelet   TSH   Immunization due       Relevant Orders   Pfizer Comirnaty Covid -19 Vaccine 94yrs and older   Flu vaccine trivalent PF, 6mos and older(Flulaval,Afluria,Fluarix,Fluzone)      Meds ordered this encounter  Medications   FLUoxetine (PROZAC) 10 MG capsule    Sig: Take 1 capsule (10 mg total) by mouth daily.    Dispense:  30 capsule    Refill:  1    Order Specific Question:   Supervising Provider    Answer:   Nani Gasser D [[id number]]   At home sleep study for OSA. Explained to patient what sleep study entails. Started Prozac for increased anxiety. Advised patient to continue taking at home BPs and to call our office if they remain elevated. Flu and COVID vaccines administered. Follow up in about 2 months  for review of sleep study results, anxiety, and BP.    Return in about 2 months (around 07/23/2023).  AnnaCollin M Mace, Student-PA

## 2023-05-24 DIAGNOSIS — Z9189 Other specified personal risk factors, not elsewhere classified: Secondary | ICD-10-CM | POA: Insufficient documentation

## 2023-05-24 DIAGNOSIS — R002 Palpitations: Secondary | ICD-10-CM | POA: Diagnosis not present

## 2023-05-24 DIAGNOSIS — Z23 Encounter for immunization: Secondary | ICD-10-CM | POA: Diagnosis not present

## 2023-05-24 NOTE — Telephone Encounter (Signed)
Please abstract labs.  

## 2023-05-25 NOTE — Telephone Encounter (Signed)
error 

## 2023-05-26 DIAGNOSIS — M47816 Spondylosis without myelopathy or radiculopathy, lumbar region: Secondary | ICD-10-CM | POA: Diagnosis not present

## 2023-05-31 DIAGNOSIS — M47816 Spondylosis without myelopathy or radiculopathy, lumbar region: Secondary | ICD-10-CM | POA: Diagnosis not present

## 2023-06-03 ENCOUNTER — Encounter: Payer: Self-pay | Admitting: Physician Assistant

## 2023-06-15 DIAGNOSIS — G4730 Sleep apnea, unspecified: Secondary | ICD-10-CM | POA: Diagnosis not present

## 2023-06-16 DIAGNOSIS — M47816 Spondylosis without myelopathy or radiculopathy, lumbar region: Secondary | ICD-10-CM | POA: Diagnosis not present

## 2023-06-17 ENCOUNTER — Encounter: Payer: Self-pay | Admitting: Physician Assistant

## 2023-06-17 DIAGNOSIS — G4733 Obstructive sleep apnea (adult) (pediatric): Secondary | ICD-10-CM

## 2023-06-17 NOTE — Progress Notes (Signed)
Moderate sleep apnea. I would like to start CPAP at night and then recheck after 1 month. Are you ok with me placing this order?

## 2023-06-23 ENCOUNTER — Telehealth: Payer: Self-pay | Admitting: Physician Assistant

## 2023-06-23 DIAGNOSIS — M47816 Spondylosis without myelopathy or radiculopathy, lumbar region: Secondary | ICD-10-CM | POA: Diagnosis not present

## 2023-06-23 NOTE — Telephone Encounter (Signed)
 CPAP orders,Sleep study report, clinical notes, demographics, and copies of insurance cards have been faxed to [facility] at [phone]. Office will contact patient to complete registration in order to ship home sleep study equipment.

## 2023-06-24 DIAGNOSIS — R0602 Shortness of breath: Secondary | ICD-10-CM | POA: Diagnosis not present

## 2023-06-24 DIAGNOSIS — R079 Chest pain, unspecified: Secondary | ICD-10-CM | POA: Diagnosis not present

## 2023-06-24 DIAGNOSIS — R0789 Other chest pain: Secondary | ICD-10-CM | POA: Diagnosis not present

## 2023-06-28 DIAGNOSIS — R079 Chest pain, unspecified: Secondary | ICD-10-CM | POA: Diagnosis not present

## 2023-06-28 DIAGNOSIS — G4733 Obstructive sleep apnea (adult) (pediatric): Secondary | ICD-10-CM | POA: Diagnosis not present

## 2023-07-07 DIAGNOSIS — M47816 Spondylosis without myelopathy or radiculopathy, lumbar region: Secondary | ICD-10-CM | POA: Diagnosis not present

## 2023-07-14 DIAGNOSIS — M5416 Radiculopathy, lumbar region: Secondary | ICD-10-CM | POA: Diagnosis not present

## 2023-07-28 ENCOUNTER — Encounter: Payer: Self-pay | Admitting: Physician Assistant

## 2023-07-28 DIAGNOSIS — I10 Essential (primary) hypertension: Secondary | ICD-10-CM

## 2023-07-29 DIAGNOSIS — G4733 Obstructive sleep apnea (adult) (pediatric): Secondary | ICD-10-CM | POA: Diagnosis not present

## 2023-08-04 MED ORDER — LOSARTAN POTASSIUM 25 MG PO TABS: 25.0000 mg | ORAL_TABLET | Freq: Every day | ORAL | 1 refills | Status: DC

## 2023-11-09 DIAGNOSIS — J0100 Acute maxillary sinusitis, unspecified: Secondary | ICD-10-CM | POA: Diagnosis not present

## 2023-11-09 DIAGNOSIS — J4520 Mild intermittent asthma, uncomplicated: Secondary | ICD-10-CM | POA: Diagnosis not present

## 2023-11-09 DIAGNOSIS — I10 Essential (primary) hypertension: Secondary | ICD-10-CM | POA: Diagnosis not present

## 2024-01-17 ENCOUNTER — Encounter: Payer: Self-pay | Admitting: Physician Assistant

## 2024-01-17 ENCOUNTER — Ambulatory Visit (INDEPENDENT_AMBULATORY_CARE_PROVIDER_SITE_OTHER): Admitting: Physician Assistant

## 2024-01-17 VITALS — BP 136/68 | HR 64 | Ht 74.0 in | Wt 252.0 lb

## 2024-01-17 DIAGNOSIS — Z131 Encounter for screening for diabetes mellitus: Secondary | ICD-10-CM | POA: Diagnosis not present

## 2024-01-17 DIAGNOSIS — Z13220 Encounter for screening for lipoid disorders: Secondary | ICD-10-CM

## 2024-01-17 DIAGNOSIS — Z0000 Encounter for general adult medical examination without abnormal findings: Secondary | ICD-10-CM

## 2024-01-17 DIAGNOSIS — I10 Essential (primary) hypertension: Secondary | ICD-10-CM | POA: Diagnosis not present

## 2024-01-17 DIAGNOSIS — Z800 Family history of malignant neoplasm of digestive organs: Secondary | ICD-10-CM | POA: Insufficient documentation

## 2024-01-17 MED ORDER — LOSARTAN POTASSIUM 25 MG PO TABS: 25.0000 mg | ORAL_TABLET | Freq: Every day | ORAL | 1 refills | Status: DC

## 2024-01-17 NOTE — Patient Instructions (Signed)
 Health Maintenance, Male  Adopting a healthy lifestyle and getting preventive care are important in promoting health and wellness. Ask your health care provider about:  The right schedule for you to have regular tests and exams.  Things you can do on your own to prevent diseases and keep yourself healthy.  What should I know about diet, weight, and exercise?  Eat a healthy diet    Eat a diet that includes plenty of vegetables, fruits, low-fat dairy products, and lean protein.  Do not eat a lot of foods that are high in solid fats, added sugars, or sodium.  Maintain a healthy weight  Body mass index (BMI) is a measurement that can be used to identify possible weight problems. It estimates body fat based on height and weight. Your health care provider can help determine your BMI and help you achieve or maintain a healthy weight.  Get regular exercise  Get regular exercise. This is one of the most important things you can do for your health. Most adults should:  Exercise for at least 150 minutes each week. The exercise should increase your heart rate and make you sweat (moderate-intensity exercise).  Do strengthening exercises at least twice a week. This is in addition to the moderate-intensity exercise.  Spend less time sitting. Even light physical activity can be beneficial.  Watch cholesterol and blood lipids  Have your blood tested for lipids and cholesterol at 33 years of age, then have this test every 5 years.  You may need to have your cholesterol levels checked more often if:  Your lipid or cholesterol levels are high.  You are older than 33 years of age.  You are at high risk for heart disease.  What should I know about cancer screening?  Many types of cancers can be detected early and may often be prevented. Depending on your health history and family history, you may need to have cancer screening at various ages. This may include screening for:  Colorectal cancer.  Prostate cancer.  Skin cancer.  Lung  cancer.  What should I know about heart disease, diabetes, and high blood pressure?  Blood pressure and heart disease  High blood pressure causes heart disease and increases the risk of stroke. This is more likely to develop in people who have high blood pressure readings or are overweight.  Talk with your health care provider about your target blood pressure readings.  Have your blood pressure checked:  Every 3-5 years if you are 9-95 years of age.  Every year if you are 85 years old or older.  If you are between the ages of 29 and 29 and are a current or former smoker, ask your health care provider if you should have a one-time screening for abdominal aortic aneurysm (AAA).  Diabetes  Have regular diabetes screenings. This checks your fasting blood sugar level. Have the screening done:  Once every three years after age 23 if you are at a normal weight and have a low risk for diabetes.  More often and at a younger age if you are overweight or have a high risk for diabetes.  What should I know about preventing infection?  Hepatitis B  If you have a higher risk for hepatitis B, you should be screened for this virus. Talk with your health care provider to find out if you are at risk for hepatitis B infection.  Hepatitis C  Blood testing is recommended for:  Everyone born from [birth year] through 1965.  Anyone  with known risk factors for hepatitis C.  Sexually transmitted infections (STIs)  You should be screened each year for STIs, including gonorrhea and chlamydia, if:  You are sexually active and are younger than 33 years of age.  You are older than 33 years of age and your health care provider tells you that you are at risk for this type of infection.  Your sexual activity has changed since you were last screened, and you are at increased risk for chlamydia or gonorrhea. Ask your health care provider if you are at risk.  Ask your health care provider about whether you are at high risk for HIV. Your health care provider  may recommend a prescription medicine to help prevent HIV infection. If you choose to take medicine to prevent HIV, you should first get tested for HIV. You should then be tested every 3 months for as long as you are taking the medicine.  Follow these instructions at home:  Alcohol use  Do not drink alcohol if your health care provider tells you not to drink.  If you drink alcohol:  Limit how much you have to 0-2 drinks a day.  Know how much alcohol is in your drink. In the [place] one drink equals one 12 oz bottle of beer (355 mL), one 5 oz glass of wine (148 mL), or one 1 oz glass of hard liquor (44 mL).  Lifestyle  Do not use any products that contain nicotine or tobacco. These products include cigarettes, chewing tobacco, and vaping devices, such as e-cigarettes. If you need help quitting, ask your health care provider.  Do not use street drugs.  Do not share needles.  Ask your health care provider for help if you need support or information about quitting drugs.  General instructions  Schedule regular health, dental, and eye exams.  Stay current with your vaccines.  Tell your health care provider if:  You often feel depressed.  You have ever been abused or do not feel safe at home.  Summary  Adopting a healthy lifestyle and getting preventive care are important in promoting health and wellness.  Follow your health care provider's instructions about healthy diet, exercising, and getting tested or screened for diseases.  Follow your health care provider's instructions on monitoring your cholesterol and blood pressure.  This information is not intended to replace advice given to you by your health care provider. Make sure you discuss any questions you have with your health care provider.  Document Revised: 11/10/2020 Document Reviewed: 11/10/2020  Elsevier Patient Education  2024 [employer].

## 2024-01-18 ENCOUNTER — Ambulatory Visit: Payer: Self-pay | Admitting: Physician Assistant

## 2024-01-18 LAB — LIPID PANEL
Chol/HDL Ratio: 3.2 ratio (ref 0.0–5.0)
Cholesterol, Total: 172 mg/dL (ref 100–199)
HDL: 54 mg/dL (ref >39)
LDL Chol Calc (NIH): 106 mg/dL — ABNORMAL HIGH (ref 0–99)
Triglycerides: 63 mg/dL (ref 0–149)
VLDL Cholesterol Cal: 12 mg/dL (ref 5–40)

## 2024-01-18 LAB — CBC WITH DIFFERENTIAL/PLATELET
Basophils Absolute: 0.1 x10E3/uL (ref 0.0–0.2)
Basos: 1 %
EOS (ABSOLUTE): 0.1 x10E3/uL (ref 0.0–0.4)
Eos: 2 %
Hematocrit: 51.1 % — ABNORMAL HIGH (ref 37.5–51.0)
Hemoglobin: 16.4 g/dL (ref 13.0–17.7)
Immature Grans (Abs): 0 x10E3/uL (ref 0.0–0.1)
Immature Granulocytes: 0 %
Lymphocytes Absolute: 1.5 x10E3/uL (ref 0.7–3.1)
Lymphs: 29 %
MCH: 28.9 pg (ref 26.6–33.0)
MCHC: 32.1 g/dL (ref 31.5–35.7)
MCV: 90 fL (ref 79–97)
Monocytes Absolute: 0.3 x10E3/uL (ref 0.1–0.9)
Monocytes: 5 %
Neutrophils Absolute: 3.2 x10E3/uL (ref 1.4–7.0)
Neutrophils: 63 %
Platelets: 225 x10E3/uL (ref 150–450)
RBC: 5.67 x10E6/uL (ref 4.14–5.80)
RDW: 12.6 % (ref 11.6–15.4)
WBC: 5 x10E3/uL (ref 3.4–10.8)

## 2024-01-18 LAB — CMP14+EGFR
ALT: 20 IU/L (ref 0–44)
AST: 21 IU/L (ref 0–40)
Albumin: 4.8 g/dL (ref 4.1–5.1)
Alkaline Phosphatase: 87 IU/L (ref 44–121)
BUN/Creatinine Ratio: 15 (ref 9–20)
BUN: 15 mg/dL (ref 6–20)
Bilirubin Total: 0.7 mg/dL (ref 0.0–1.2)
CO2: 22 mmol/L (ref 20–29)
Calcium: 10 mg/dL (ref 8.7–10.2)
Chloride: 100 mmol/L (ref 96–106)
Creatinine, Ser: 1.02 mg/dL (ref 0.76–1.27)
Globulin, Total: 2.3 g/dL (ref 1.5–4.5)
Glucose: 90 mg/dL (ref 70–99)
Potassium: 4.3 mmol/L (ref 3.5–5.2)
Sodium: 139 mmol/L (ref 134–144)
Total Protein: 7.1 g/dL (ref 6.0–8.5)
eGFR: 100 mL/min/1.73 (ref >59)

## 2024-01-18 LAB — TSH: TSH: 1.59 u[IU]/mL (ref 0.450–4.500)

## 2024-01-18 NOTE — Progress Notes (Signed)
 Dan,   Kidney, liver, glucose look great.  Thyroid  looks great.  LDL, bad cholesterol,  not quite optimal but still looks good. Keep limiting fried/fatty/processed foods and aim for 150 minutes of exercise a week!

## 2024-01-20 ENCOUNTER — Encounter: Payer: Self-pay | Admitting: Physician Assistant

## 2024-01-20 NOTE — Progress Notes (Signed)
 Complete physical exam  Patient: Juan Bryan   DOB: 08/25/1990   33 y.o. Male  MRN: [id number]  Subjective:    Chief Complaint  Patient presents with   Annual Exam    Fasting for labs gerd med , med refills , phg gqd    Juan Bryan is a 33 y.o. male who presents today for a complete physical exam. He reports consuming a general diet. The patient does not participate in regular exercise at present. He generally feels well. He reports sleeping fairly well. He does not have additional problems to discuss today.   He does mention his father just passed away from liver cancer. He is concerned about his overall cancer risk and how he can reduce it.    Most recent fall risk assessment:    01/20/2024   12:38 PM  Fall Risk   Falls in the past year? 0  Number falls in past yr: 0  Injury with Fall? 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   Sebaceous cyst 10/29/2022   Muscle spasm 10/29/2022   Nocturia 01/13/2022   Routine physical examination 01/13/2022   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   Intermittent alternating exotropia 01/11/2022   Spasm of eye muscle, bilateral 01/11/2022   Strain of rhomboid muscle 03/24/2021   Migraine with aura and without status migrainosus, not intractable 03/24/2021   Acute middle ear effusion, right 12/22/2020   Post-viral cough syndrome 12/22/2020   Elevated blood pressure reading 04/04/2020   Muscle spasm of back 04/01/2020   Panic attack 03/31/2020    Chronic left-sided low back pain with left-sided sciatica 03/31/2020   Non-restorative sleep 12/20/2017   Sleep talking 12/20/2017   Snoring 12/20/2017   No energy 12/20/2017   White coat syndrome with diagnosis of hypertension 05/18/2017   Precordial chest pain 04/27/2017   Mild intermittent asthma with exacerbation 04/27/2017   Seasonal allergies 04/27/2017   Essential hypertension 08/20/2016   Varicocele 08/20/2016   Left testicular pain 08/20/2016   Class 1 obesity due to excess calories without serious comorbidity with body mass index (BMI) of 32.0 to 32.9 in adult 06/02/2016   Finger pain, right 05/18/2016   Knee pain, bilateral 05/18/2016   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/18/2016   Reactive airway disease 07/09/2013   Past Medical History:  Diagnosis Date   Asthma 07/09/2013   Family History  Problem Relation Age of Onset   Diabetes Mother    Hypertension Mother    Diabetes Father    Hypertension Father    Liver cancer Father    Allergies  Allergen Reactions   Penicillin G Other (See Comments)   Prednisone      Made feel weird with hot spots on body      Patient Care Team: Anthonie Lotito L, PA-C as PCP - General (Family Medicine)   Outpatient Medications Prior to Visit  Medication Sig  albuterol  (VENTOLIN  HFA) 108 (90 Base) MCG/ACT inhaler Inhale 2 puffs into the lungs every 6 (six) hours as needed for wheezing or shortness of breath.   azelastine  (ASTELIN ) 0.1 % nasal spray Place 2 sprays into both nostrils 2 (two) times daily. Use in each nostril as directed   loratadine (CLARITIN) 10 MG tablet Take 10 mg by mouth daily.   LORazepam  (ATIVAN ) 0.5 MG tablet Take as needed for anxiety attacks as needed no more than once a day.   magnesium oxide (MAG-OX) 400 (240 Mg) MG tablet Take 400 mg by mouth daily.   omeprazole  (PRILOSEC) 40 MG capsule Take 1 capsule (40 mg total) by mouth daily.   triamcinolone (NASACORT) 55 MCG/ACT AERO nasal inhaler  Place 2 sprays into the nose daily.   [DISCONTINUED] budesonide -formoterol  (SYMBICORT ) 160-4.5 MCG/ACT inhaler Inhale 2 puffs into the lungs 2 (two) times daily.   [DISCONTINUED] FLUoxetine  (PROZAC ) 10 MG capsule Take 1 capsule (10 mg total) by mouth daily.   [DISCONTINUED] losartan  (COZAAR ) 25 MG tablet Take 1 tablet (25 mg total) by mouth daily.   No facility-administered medications prior to visit.    ROS        Objective:     BP 136/68   Pulse 64   Ht 6' 2 (1.88 m)   Wt 252 lb (114.3 kg)   SpO2 99%   BMI 32.35 kg/m  BP Readings from Last 3 Encounters:  01/17/24 136/68  05/23/23 (!) 142/84  11/16/22 (!) 151/96   Wt Readings from Last 3 Encounters:  01/17/24 252 lb (114.3 kg)  05/23/23 255 lb (115.7 kg)  11/16/22 253 lb (114.8 kg)      Physical Exam  BP 136/68   Pulse 64   Ht 6' 2 (1.88 m)   Wt 252 lb (114.3 kg)   SpO2 99%   BMI 32.35 kg/m   General Appearance:    Alert, cooperative, no distress, appears stated age  Head:    Normocephalic, without obvious abnormality, atraumatic  Eyes:    PERRL, conjunctiva/corneas clear, EOM's intact, fundi    benign, both eyes       Ears:    Normal TM's and external ear canals, both ears  Nose:   Nares normal, septum midline, mucosa normal, no drainage    or sinus tenderness  Throat:   Lips, mucosa, and tongue normal; teeth and gums normal  Neck:   Supple, symmetrical, trachea midline, no adenopathy;       thyroid :  No enlargement/tenderness/nodules; no carotid   bruit or JVD  Back:     Symmetric, no curvature, ROM normal, no CVA tenderness  Lungs:     Clear to auscultation bilaterally, respirations unlabored  Chest wall:    No tenderness or deformity  Heart:    Regular rate and rhythm, S1 and S2 normal, no murmur, rub   or gallop  Abdomen:     Soft, non-tender, bowel sounds active all four quadrants,    no masses, no organomegaly        Extremities:   Extremities normal, atraumatic, no cyanosis or edema  Pulses:    2+ and symmetric all extremities  Skin:   Skin color, texture, turgor normal, no rashes or lesions  Lymph nodes:   Cervical, supraclavicular, and axillary nodes normal  Neurologic:   CNII-XII intact. Normal strength, sensation and reflexes      throughout        Assessment & Plan:    Routine Health Maintenance and Physical Exam  Immunization History  Administered Date(s) Administered   Influenza, Seasonal, Injecte, Preservative Fre 05/24/2023   Influenza,inj,Quad PF,6+ Mos 04/07/2018, 03/24/2021   PFIZER(Purple Top)SARS-COV-2 Vaccination 09/08/2019, 10/01/2019   Pfizer(Comirnaty)Fall Seasonal Vaccine 12 years and older 05/24/2023   Tdap 11/16/2022    Health Maintenance  Topic Date Due   Hepatitis B Vaccines (1 of 3 - 19+ 3-dose series) Never done   COVID-19 Vaccine (4 - Pfizer risk 2024-25 season) 02/05/2024 (Originally 11/21/2023)   HIV Screening  05/22/2024 (Originally 10/09/2005)   Pneumococcal Vaccine 63-72 Years old (1 of 2 - PCV) 01/19/2025 (Originally 10/09/2009)   HPV VACCINES (1 - 3-dose SCDM series) 01/19/2025 (Originally 10/09/2017)   INFLUENZA VACCINE  02/03/2024   DTaP/Tdap/Td (2 - Td or Tdap) 11/15/2032   Hepatitis C Screening  Completed   Meningococcal B Vaccine  Aged Out    Discussed health benefits of physical activity, and encouraged him to engage in regular exercise appropriate for his age and condition.  SABRADelroy Bryan was seen today for annual exam.  Diagnoses and all orders for this visit:  Routine physical examination -     CBC with Differential/Platelet -     CMP14+EGFR -     Lipid panel -     TSH  Essential hypertension -     CMP14+EGFR  White coat syndrome with diagnosis of hypertension -     losartan  (COZAAR ) 25 MG tablet; Take 1 tablet (25 mg total) by mouth daily.  Screening for diabetes mellitus -     CBC with Differential/Platelet  Screening for lipid disorders -     Lipid panel  Family history of liver cancer   2nd BP check much  better Continue on losartan  daily Fasting labs ordered PHQ not to goal but father died less than 2 weeks ago Declines need for medication or counseling right now Discussed liver cancer and risk Encouraged patient to cut down on alcohol intake- he is drinking bourbon 8oz 2-3 nights a week.  Continue as needed omeprazole  for GERD   Return in about 6 months (around 07/19/2024).     Evaline Waltman, PA-C

## 2024-01-28 ENCOUNTER — Other Ambulatory Visit: Payer: Self-pay | Admitting: Physician Assistant

## 2024-01-28 DIAGNOSIS — I10 Essential (primary) hypertension: Secondary | ICD-10-CM

## 2024-03-06 ENCOUNTER — Encounter: Payer: Self-pay | Admitting: Sports Medicine

## 2024-04-17 DIAGNOSIS — J020 Streptococcal pharyngitis: Secondary | ICD-10-CM | POA: Diagnosis not present

## 2024-04-17 DIAGNOSIS — I10 Essential (primary) hypertension: Secondary | ICD-10-CM | POA: Diagnosis not present

## 2024-04-17 DIAGNOSIS — Z20822 Contact with and (suspected) exposure to covid-19: Secondary | ICD-10-CM | POA: Diagnosis not present

## 2024-04-17 DIAGNOSIS — Z20828 Contact with and (suspected) exposure to other viral communicable diseases: Secondary | ICD-10-CM | POA: Diagnosis not present

## 2024-05-18 ENCOUNTER — Encounter: Payer: Self-pay | Admitting: Physician Assistant

## 2024-05-18 DIAGNOSIS — H5034 Intermittent alternating exotropia: Secondary | ICD-10-CM | POA: Diagnosis not present

## 2024-06-14 DIAGNOSIS — H5034 Intermittent alternating exotropia: Secondary | ICD-10-CM | POA: Diagnosis not present

## 2024-06-14 DIAGNOSIS — H5213 Myopia, bilateral: Secondary | ICD-10-CM | POA: Diagnosis not present

## 2024-06-15 DIAGNOSIS — I10 Essential (primary) hypertension: Secondary | ICD-10-CM | POA: Diagnosis not present

## 2024-06-15 DIAGNOSIS — J020 Streptococcal pharyngitis: Secondary | ICD-10-CM | POA: Diagnosis not present

## 2024-07-23 ENCOUNTER — Other Ambulatory Visit: Payer: Self-pay | Admitting: Physician Assistant

## 2024-07-23 DIAGNOSIS — I10 Essential (primary) hypertension: Secondary | ICD-10-CM
